# Patient Record
Sex: Female | Born: 1990
Health system: Southern US, Community
[De-identification: ages and names within clinical notes are randomized; demographics above are authoritative.]

## PROBLEM LIST (undated history)

## (undated) DIAGNOSIS — F329 Major depressive disorder, single episode, unspecified: Secondary | ICD-10-CM

## (undated) DIAGNOSIS — G43909 Migraine, unspecified, not intractable, without status migrainosus: Secondary | ICD-10-CM

## (undated) DIAGNOSIS — F32A Depression, unspecified: Secondary | ICD-10-CM

## (undated) DIAGNOSIS — I10 Essential (primary) hypertension: Secondary | ICD-10-CM

## (undated) HISTORY — DX: Major depressive disorder, single episode, unspecified: F32.9

## (undated) HISTORY — DX: Migraine, unspecified, not intractable, without status migrainosus: G43.909

## (undated) HISTORY — PX: ANKLE SURGERY: SHX546

## (undated) HISTORY — DX: Depression, unspecified: F32.A

## (undated) HISTORY — DX: Essential (primary) hypertension: I10

---

## 2016-09-16 NOTE — L&D Delivery Note (Signed)
Delivery Note At 5:30 PM a viable female was delivered via Vaginal, Spontaneous Delivery (Presentation: DOA;  ).  APGAR: 9, 9; weight  .   Placenta status: routine , .  Cord: WNL with the following complications: none .  Cord pH: not sent  Anesthesia:   Episiotomy: None Lacerations: 2nd degree Suture Repair: 3.0 vicryl Est. Blood Loss (mL): 200  It's a girl - "Diane Rivera"! Mom to postpartum.  Baby to Couplet care / Skin to Skin.   Diane Rivera 07/03/2017, 5:42 PM

## 2016-12-03 LAB — OB RESULTS CONSOLE ANTIBODY SCREEN: Antibody Screen: NEGATIVE

## 2016-12-03 LAB — OB RESULTS CONSOLE ABO/RH: RH Type: POSITIVE

## 2016-12-03 LAB — OB RESULTS CONSOLE RPR: RPR: NONREACTIVE

## 2016-12-03 LAB — OB RESULTS CONSOLE GC/CHLAMYDIA
Chlamydia: NEGATIVE
Gonorrhea: NEGATIVE

## 2016-12-03 LAB — OB RESULTS CONSOLE HEPATITIS B SURFACE ANTIGEN: Hepatitis B Surface Ag: NEGATIVE

## 2016-12-03 LAB — OB RESULTS CONSOLE RUBELLA ANTIBODY, IGM: Rubella: IMMUNE

## 2016-12-03 LAB — OB RESULTS CONSOLE HIV ANTIBODY (ROUTINE TESTING): HIV: NONREACTIVE

## 2017-02-06 DIAGNOSIS — Z363 Encounter for antenatal screening for malformations: Secondary | ICD-10-CM | POA: Diagnosis not present

## 2017-02-06 DIAGNOSIS — Z3400 Encounter for supervision of normal first pregnancy, unspecified trimester: Secondary | ICD-10-CM | POA: Diagnosis not present

## 2017-02-18 DIAGNOSIS — Z362 Encounter for other antenatal screening follow-up: Secondary | ICD-10-CM | POA: Diagnosis not present

## 2017-02-18 DIAGNOSIS — Z3492 Encounter for supervision of normal pregnancy, unspecified, second trimester: Secondary | ICD-10-CM | POA: Diagnosis not present

## 2017-03-18 DIAGNOSIS — Z3A24 24 weeks gestation of pregnancy: Secondary | ICD-10-CM | POA: Diagnosis not present

## 2017-03-18 DIAGNOSIS — Z362 Encounter for other antenatal screening follow-up: Secondary | ICD-10-CM | POA: Diagnosis not present

## 2017-04-18 DIAGNOSIS — Z3480 Encounter for supervision of other normal pregnancy, unspecified trimester: Secondary | ICD-10-CM | POA: Diagnosis not present

## 2017-04-18 DIAGNOSIS — Z23 Encounter for immunization: Secondary | ICD-10-CM | POA: Diagnosis not present

## 2017-04-18 DIAGNOSIS — Z3400 Encounter for supervision of normal first pregnancy, unspecified trimester: Secondary | ICD-10-CM | POA: Diagnosis not present

## 2017-06-13 DIAGNOSIS — Z3685 Encounter for antenatal screening for Streptococcus B: Secondary | ICD-10-CM | POA: Diagnosis not present

## 2017-06-13 DIAGNOSIS — Z3400 Encounter for supervision of normal first pregnancy, unspecified trimester: Secondary | ICD-10-CM | POA: Diagnosis not present

## 2017-06-13 DIAGNOSIS — Z113 Encounter for screening for infections with a predominantly sexual mode of transmission: Secondary | ICD-10-CM | POA: Diagnosis not present

## 2017-06-21 ENCOUNTER — Inpatient Hospital Stay (HOSPITAL_COMMUNITY): Admission: AD | Admit: 2017-06-21 | Payer: Self-pay | Source: Ambulatory Visit | Admitting: Obstetrics and Gynecology

## 2017-07-02 ENCOUNTER — Telehealth (HOSPITAL_COMMUNITY): Payer: Self-pay | Admitting: *Deleted

## 2017-07-02 ENCOUNTER — Encounter (HOSPITAL_COMMUNITY): Payer: Self-pay | Admitting: *Deleted

## 2017-07-02 LAB — OB RESULTS CONSOLE GBS: GBS: NEGATIVE

## 2017-07-02 NOTE — Telephone Encounter (Signed)
Preadmission screen  

## 2017-07-02 NOTE — H&P (Signed)
Diane LaiCatherine Rivera is a 26 y.o. female presenting for IOL. OB History    Gravida Para Term Preterm AB Living   2 1 1     1    SAB TAB Ectopic Multiple Live Births           1     Past Medical History:  Diagnosis Date  . Depression    MODERATE PP DEPRESSIONS meds for 6 mos  . Hypertension    Past Surgical History:  Procedure Laterality Date  . ANKLE SURGERY     Family History: family history includes Hypertension in her father; Lung cancer in her maternal grandmother. Social History:  reports that she has never smoked. She has never used smokeless tobacco. She reports that she does not drink alcohol or use drugs.     Maternal Diabetes: No Genetic Screening: Normal Maternal Ultrasounds/Referrals: Normal Fetal Ultrasounds or other Referrals:  None Maternal Substance Abuse:  No Significant Maternal Medications:  None Significant Maternal Lab Results:  None Other Comments:  None  ROS History   Last menstrual period 10/01/2016. Exam Physical Exam  (from clinic) NAD, A&O NWOB Abd soft, nondistended, gravid SVE 2/50/-1, vertex  Prenatal labs: ABO, Rh: O/Positive/-- (03/20 0000) Antibody: Negative (03/20 0000) Rubella: Immune (03/20 0000) RPR: Nonreactive (03/20 0000)  HBsAg: Negative (03/20 0000)  HIV: Non-reactive (03/20 0000)  GBS: Negative (10/17 0000)   Assessment/Plan: 26yo G2P1001 @ 39.2wga presenting for eIOL. Plan pitocin/AROM.  Pregnancy uncomplicated.  H/o cHTN w/first pregnancy - none this pregnancy. GBS neg.    Diane Rivera 07/02/2017, 1:46 PM

## 2017-07-03 ENCOUNTER — Encounter (HOSPITAL_COMMUNITY): Payer: Self-pay

## 2017-07-03 ENCOUNTER — Inpatient Hospital Stay (HOSPITAL_COMMUNITY): Payer: 59 | Admitting: Anesthesiology

## 2017-07-03 ENCOUNTER — Inpatient Hospital Stay (HOSPITAL_COMMUNITY)
Admission: RE | Admit: 2017-07-03 | Discharge: 2017-07-04 | DRG: 807 | Disposition: A | Discharge status: Home / Self Care | Payer: 59 | Source: Ambulatory Visit | Attending: Obstetrics and Gynecology | Admitting: Obstetrics and Gynecology

## 2017-07-03 VITALS — BP 131/75 | HR 91 | Temp 97.9°F | Resp 20 | Ht 66.0 in | Wt 295.0 lb

## 2017-07-03 DIAGNOSIS — Z3490 Encounter for supervision of normal pregnancy, unspecified, unspecified trimester: Secondary | ICD-10-CM

## 2017-07-03 DIAGNOSIS — O26893 Other specified pregnancy related conditions, third trimester: Secondary | ICD-10-CM | POA: Diagnosis present

## 2017-07-03 DIAGNOSIS — O164 Unspecified maternal hypertension, complicating childbirth: Secondary | ICD-10-CM | POA: Diagnosis not present

## 2017-07-03 DIAGNOSIS — Z3A39 39 weeks gestation of pregnancy: Secondary | ICD-10-CM

## 2017-07-03 LAB — TYPE AND SCREEN
ABO/RH(D): O POS
Antibody Screen: NEGATIVE

## 2017-07-03 LAB — PROTEIN / CREATININE RATIO, URINE
Creatinine, Urine: 60 mg/dL
Total Protein, Urine: <6 mg/dL

## 2017-07-03 LAB — COMPREHENSIVE METABOLIC PANEL
ALT: 14 U/L (ref 14–54)
AST: 25 U/L (ref 15–41)
Albumin: 2.7 g/dL — ABNORMAL LOW (ref 3.5–5.0)
Alkaline Phosphatase: 125 U/L (ref 38–126)
Anion gap: 10 (ref 5–15)
BUN: 6 mg/dL (ref 6–20)
CO2: 23 mmol/L (ref 22–32)
Calcium: 8.7 mg/dL — ABNORMAL LOW (ref 8.9–10.3)
Chloride: 101 mmol/L (ref 101–111)
Creatinine, Ser: 0.58 mg/dL (ref 0.44–1.00)
GFR calc Af Amer: >60 mL/min (ref >60)
GFR calc non Af Amer: >60 mL/min (ref >60)
Glucose, Bld: 89 mg/dL (ref 65–99)
Potassium: 3.8 mmol/L (ref 3.5–5.1)
Sodium: 134 mmol/L — ABNORMAL LOW (ref 135–145)
Total Bilirubin: 0.3 mg/dL (ref 0.3–1.2)
Total Protein: 6.5 g/dL (ref 6.5–8.1)

## 2017-07-03 LAB — ABO/RH: ABO/RH(D): O POS

## 2017-07-03 LAB — CBC
HCT: 34.6 % — ABNORMAL LOW (ref 36.0–46.0)
Hemoglobin: 11.2 g/dL — ABNORMAL LOW (ref 12.0–15.0)
MCH: 28 pg (ref 26.0–34.0)
MCHC: 32.4 g/dL (ref 30.0–36.0)
MCV: 86.5 fL (ref 78.0–100.0)
Platelets: 222 10*3/uL (ref 150–400)
RBC: 4 MIL/uL (ref 3.87–5.11)
RDW: 14.8 % (ref 11.5–15.5)
WBC: 11.1 10*3/uL — ABNORMAL HIGH (ref 4.0–10.5)

## 2017-07-03 LAB — RPR: RPR Ser Ql: NONREACTIVE

## 2017-07-03 LAB — PREGNANCY, URINE: Preg Test, Ur: POSITIVE — AB

## 2017-07-03 MED ORDER — DIBUCAINE 1 % RE OINT: 1.0000 "application " | TOPICAL_OINTMENT | RECTAL | Status: DC | PRN

## 2017-07-03 MED ORDER — EPHEDRINE 5 MG/ML INJ
10.0000 mg | INTRAVENOUS | Status: DC | PRN
  Filled 2017-07-03: qty 2

## 2017-07-03 MED ORDER — TERBUTALINE SULFATE 1 MG/ML IJ SOLN
0.2500 mg | Freq: Once | INTRAMUSCULAR | Status: DC | PRN
  Filled 2017-07-03: qty 1

## 2017-07-03 MED ORDER — PHENYLEPHRINE 40 MCG/ML (10ML) SYRINGE FOR IV PUSH (FOR BLOOD PRESSURE SUPPORT)
80.0000 ug | PREFILLED_SYRINGE | INTRAVENOUS | Status: DC | PRN
  Filled 2017-07-03: qty 10
  Filled 2017-07-03: qty 5

## 2017-07-03 MED ORDER — ACETAMINOPHEN 325 MG PO TABS: 650.0000 mg | ORAL_TABLET | ORAL | Status: DC | PRN

## 2017-07-03 MED ORDER — PHENYLEPHRINE 40 MCG/ML (10ML) SYRINGE FOR IV PUSH (FOR BLOOD PRESSURE SUPPORT)
80.0000 ug | PREFILLED_SYRINGE | INTRAVENOUS | Status: DC | PRN
  Filled 2017-07-03: qty 5

## 2017-07-03 MED ORDER — TETANUS-DIPHTH-ACELL PERTUSSIS 5-2.5-18.5 LF-MCG/0.5 IM SUSP: 0.5000 mL | Freq: Once | INTRAMUSCULAR | Status: DC

## 2017-07-03 MED ORDER — SOD CITRATE-CITRIC ACID 500-334 MG/5ML PO SOLN: 30.0000 mL | ORAL | Status: DC | PRN

## 2017-07-03 MED ORDER — FENTANYL 2.5 MCG/ML BUPIVACAINE 1/10 % EPIDURAL INFUSION (WH - ANES)
14.0000 mL/h | INTRAMUSCULAR | Status: DC | PRN
  Administered 2017-07-03: 14 mL/h via EPIDURAL
  Filled 2017-07-03: qty 100

## 2017-07-03 MED ORDER — ONDANSETRON HCL 4 MG/2ML IJ SOLN: 4.0000 mg | Freq: Four times a day (QID) | INTRAMUSCULAR | Status: DC | PRN

## 2017-07-03 MED ORDER — LACTATED RINGERS IV SOLN
INTRAVENOUS | Status: DC
  Administered 2017-07-03: 08:00:00 via INTRAVENOUS

## 2017-07-03 MED ORDER — OXYCODONE-ACETAMINOPHEN 5-325 MG PO TABS: 1.0000 | ORAL_TABLET | ORAL | Status: DC | PRN

## 2017-07-03 MED ORDER — LIDOCAINE HCL (PF) 1 % IJ SOLN
30.0000 mL | INTRAMUSCULAR | Status: DC | PRN
  Filled 2017-07-03: qty 30

## 2017-07-03 MED ORDER — FLEET ENEMA 7-19 GM/118ML RE ENEM
1.0000 | ENEMA | RECTAL | Status: DC | PRN
Start: 2017-07-03 — End: 2017-07-03

## 2017-07-03 MED ORDER — SIMETHICONE 80 MG PO CHEW: 80.0000 mg | CHEWABLE_TABLET | ORAL | Status: DC | PRN

## 2017-07-03 MED ORDER — ZOLPIDEM TARTRATE 5 MG PO TABS: 5.0000 mg | ORAL_TABLET | Freq: Every evening | ORAL | Status: DC | PRN

## 2017-07-03 MED ORDER — OXYTOCIN 40 UNITS IN LACTATED RINGERS INFUSION - SIMPLE MED
1.0000 m[IU]/min | INTRAVENOUS | Status: DC
  Administered 2017-07-03: 2 m[IU]/min via INTRAVENOUS
  Filled 2017-07-03: qty 1000

## 2017-07-03 MED ORDER — OXYCODONE-ACETAMINOPHEN 5-325 MG PO TABS: 2.0000 | ORAL_TABLET | ORAL | Status: DC | PRN

## 2017-07-03 MED ORDER — OXYTOCIN BOLUS FROM INFUSION: 500.0000 mL | Freq: Once | INTRAVENOUS | Status: DC

## 2017-07-03 MED ORDER — LACTATED RINGERS IV SOLN
500.0000 mL | Freq: Once | INTRAVENOUS | Status: AC
  Administered 2017-07-03: 500 mL via INTRAVENOUS

## 2017-07-03 MED ORDER — LACTATED RINGERS IV SOLN: 500.0000 mL | Freq: Once | INTRAVENOUS | Status: DC

## 2017-07-03 MED ORDER — HYDROMORPHONE HCL 1 MG/ML IJ SOLN: 0.5000 mg | INTRAMUSCULAR | Status: DC | PRN

## 2017-07-03 MED ORDER — ONDANSETRON HCL 4 MG PO TABS: 4.0000 mg | ORAL_TABLET | ORAL | Status: DC | PRN

## 2017-07-03 MED ORDER — LIDOCAINE HCL (PF) 1 % IJ SOLN
INTRAMUSCULAR | Status: DC | PRN
  Administered 2017-07-03 (×4): 5 mL via EPIDURAL

## 2017-07-03 MED ORDER — WITCH HAZEL-GLYCERIN EX PADS: 1.0000 "application " | MEDICATED_PAD | CUTANEOUS | Status: DC | PRN

## 2017-07-03 MED ORDER — DIPHENHYDRAMINE HCL 25 MG PO CAPS: 25.0000 mg | ORAL_CAPSULE | Freq: Four times a day (QID) | ORAL | Status: DC | PRN

## 2017-07-03 MED ORDER — LACTATED RINGERS IV SOLN: 500.0000 mL | INTRAVENOUS | Status: DC | PRN

## 2017-07-03 MED ORDER — DIPHENHYDRAMINE HCL 50 MG/ML IJ SOLN: 12.5000 mg | INTRAMUSCULAR | Status: DC | PRN

## 2017-07-03 MED ORDER — COCONUT OIL OIL: 1.0000 "application " | TOPICAL_OIL | Status: DC | PRN

## 2017-07-03 MED ORDER — OXYCODONE HCL 5 MG PO TABS: 10.0000 mg | ORAL_TABLET | ORAL | Status: DC | PRN

## 2017-07-03 MED ORDER — PRENATAL MULTIVITAMIN CH
1.0000 | ORAL_TABLET | Freq: Every day | ORAL | Status: DC
  Administered 2017-07-04: 1 via ORAL
  Filled 2017-07-03: qty 1

## 2017-07-03 MED ORDER — HYDROMORPHONE BOLUS VIA INFUSION: 0.5000 mg | INTRAVENOUS | Status: DC | PRN

## 2017-07-03 MED ORDER — IBUPROFEN 600 MG PO TABS
600.0000 mg | ORAL_TABLET | Freq: Four times a day (QID) | ORAL | Status: DC
  Administered 2017-07-04 (×3): 600 mg via ORAL
  Filled 2017-07-03 (×3): qty 1

## 2017-07-03 MED ORDER — SENNOSIDES-DOCUSATE SODIUM 8.6-50 MG PO TABS
2.0000 | ORAL_TABLET | ORAL | Status: DC
  Administered 2017-07-04: 2 via ORAL
  Filled 2017-07-03: qty 2

## 2017-07-03 MED ORDER — ONDANSETRON HCL 4 MG/2ML IJ SOLN
4.0000 mg | INTRAMUSCULAR | Status: DC | PRN
Start: 2017-07-03 — End: 2017-07-04

## 2017-07-03 MED ORDER — BENZOCAINE-MENTHOL 20-0.5 % EX AERO
1.0000 "application " | INHALATION_SPRAY | CUTANEOUS | Status: DC | PRN
  Administered 2017-07-03: 1 via TOPICAL
  Filled 2017-07-03: qty 56

## 2017-07-03 MED ORDER — OXYTOCIN 40 UNITS IN LACTATED RINGERS INFUSION - SIMPLE MED: 2.5000 [IU]/h | INTRAVENOUS | Status: DC

## 2017-07-03 MED ORDER — OXYCODONE HCL 5 MG PO TABS: 5.0000 mg | ORAL_TABLET | ORAL | Status: DC | PRN

## 2017-07-03 NOTE — Progress Notes (Signed)
S: Comf, no c/o.  O:  Vitals:   07/03/17 0723  BP: (!) 137/96  Pulse: (!) 122  Resp: 16  Temp: 98.8 F (37.1 C)   Gen: well appearing, NAD ABd: gravid SVE 3.5/long/-2, AROM for clear fluid Toco q 5 min  A/P: 26 yo G2P1001 @ 39.2wga presenting for eIOL. Pitocin. S/p AROM for clear fluid EFW 8#0 BP mildly elevated - no s/s sev dz. Labs sent. H/o htn w/last pregnancy GBS neg  Rosie FateE Otelia Hettinger MD

## 2017-07-03 NOTE — Anesthesia Procedure Notes (Signed)
Epidural Patient location during procedure: OB Start time: 07/03/2017 3:57 PM End time: 07/03/2017 4:05 PM  Staffing Anesthesiologist: Chaney MallingHODIERNE, Bebe Moncure Performed: anesthesiologist   Preanesthetic Checklist Completed: patient identified, site marked, pre-op evaluation, timeout performed, IV checked, risks and benefits discussed and monitors and equipment checked  Epidural Patient position: sitting Prep: DuraPrep Patient monitoring: heart rate, cardiac monitor, continuous pulse ox and blood pressure Approach: midline Location: L3-L4 Injection technique: LOR air  Needle:  Needle type: Tuohy  Needle gauge: 17 G Needle length: 9 cm Needle insertion depth: 9 cm Catheter type: closed end flexible Catheter size: 19 Gauge Catheter at skin depth: 14 cm Test dose: negative and Other  Assessment Events: blood not aspirated, injection not painful, no injection resistance and negative IV test  Additional Notes Informed consent obtained prior to proceeding including risk of failure, 1% risk of PDPH, risk of minor discomfort and bruising.  Discussed rare but serious complications including epidural abscess, permanent nerve injury, epidural hematoma.  Discussed alternatives to epidural analgesia and patient desires to proceed.  Timeout performed pre-procedure verifying patient name, procedure, and platelet count.  Patient tolerated procedure well. Reason for block:procedure for pain

## 2017-07-03 NOTE — Anesthesia Preprocedure Evaluation (Signed)
Anesthesia Evaluation  Patient identified by MRN, date of birth, ID band Patient awake    Reviewed: Allergy & Precautions, H&P , NPO status , Patient's Chart, lab work & pertinent test results  Airway Mallampati: II   Neck ROM: full    Dental   Pulmonary neg pulmonary ROS,    breath sounds clear to auscultation       Cardiovascular hypertension,  Rhythm:regular Rate:Normal     Neuro/Psych    GI/Hepatic   Endo/Other    Renal/GU      Musculoskeletal   Abdominal   Peds  Hematology   Anesthesia Other Findings   Reproductive/Obstetrics (+) Pregnancy                             Anesthesia Physical Anesthesia Plan  ASA: II  Anesthesia Plan: Epidural   Post-op Pain Management:    Induction: Intravenous  PONV Risk Score and Plan: 2 and Treatment may vary due to age or medical condition  Airway Management Planned: Natural Airway  Additional Equipment:   Intra-op Plan:   Post-operative Plan:   Informed Consent: I have reviewed the patients History and Physical, chart, labs and discussed the procedure including the risks, benefits and alternatives for the proposed anesthesia with the patient or authorized representative who has indicated his/her understanding and acceptance.     Plan Discussed with: CRNA and Anesthesiologist  Anesthesia Plan Comments:         Anesthesia Quick Evaluation

## 2017-07-03 NOTE — Anesthesia Procedure Notes (Signed)
Epidural Patient location during procedure: OB Start time: 07/03/2017 3:05 PM End time: 07/03/2017 3:16 PM  Staffing Anesthesiologist: Chaney MallingHODIERNE, Seth Higginbotham Performed: anesthesiologist   Preanesthetic Checklist Completed: patient identified, site marked, pre-op evaluation, timeout performed, IV checked, risks and benefits discussed and monitors and equipment checked  Epidural Patient position: sitting Prep: DuraPrep Patient monitoring: heart rate, cardiac monitor, continuous pulse ox and blood pressure Approach: midline Location: L2-L3 Injection technique: LOR saline  Needle:  Needle type: Tuohy  Needle gauge: 17 G Needle length: 9 cm Needle insertion depth: 9 cm Catheter type: closed end flexible Catheter size: 19 Gauge Catheter at skin depth: 14 cm Test dose: negative and Other  Assessment Events: blood not aspirated, injection not painful, no injection resistance and negative IV test  Additional Notes Informed consent obtained prior to proceeding including risk of failure, 1% risk of PDPH, risk of minor discomfort and bruising.  Discussed rare but serious complications including epidural abscess, permanent nerve injury, epidural hematoma.  Discussed alternatives to epidural analgesia and patient desires to proceed.  Timeout performed pre-procedure verifying patient name, procedure, and platelet count.  Patient tolerated procedure well. Reason for block:procedure for pain

## 2017-07-03 NOTE — Anesthesia Pain Management Evaluation Note (Signed)
  CRNA Pain Management Visit Note  Patient: Diane Rivera, 26 y.o., female  "Hello I am a [facility] of the anesthesia [facility] at [facility]. We have an anesthesia team available at all times to provide care throughout the hospital, including epidural management and anesthesia for C-section. I don't know your plan for the delivery whether it a natural birth, water birth, IV sedation, nitrous supplementation, doula or epidural, but we want to meet your pain goals."   1.Was your pain managed to your expectations on prior hospitalizations?   Yes   2.What is your expectation for pain management during this hospitalization?     Labor support without medications  3.How can we help you reach that goal? Nursing intervention  Record the patient's initial score and the patient's pain goal.   Pain: 1/10  Pain Goal: 1/10 [facility] wants you to be able to say your pain was always managed very well.  [name], [name] 07/03/2017

## 2017-07-03 NOTE — Progress Notes (Signed)
4/70/-2 Cont pitocin titration   Rosie FateE Faustine Tates MD

## 2017-07-04 ENCOUNTER — Ambulatory Visit: Payer: Self-pay

## 2017-07-04 LAB — BIRTH TISSUE RECOVERY COLLECTION (PLACENTA DONATION)

## 2017-07-04 LAB — CBC
HCT: 33.5 % — ABNORMAL LOW (ref 36.0–46.0)
Hemoglobin: 10.7 g/dL — ABNORMAL LOW (ref 12.0–15.0)
MCH: 27.8 pg (ref 26.0–34.0)
MCHC: 31.9 g/dL (ref 30.0–36.0)
MCV: 87 fL (ref 78.0–100.0)
Platelets: 213 10*3/uL (ref 150–400)
RBC: 3.85 MIL/uL — ABNORMAL LOW (ref 3.87–5.11)
RDW: 15 % (ref 11.5–15.5)
WBC: 15 10*3/uL — ABNORMAL HIGH (ref 4.0–10.5)

## 2017-07-04 NOTE — Discharge Summary (Signed)
Obstetric Discharge Summary Reason for Admission: induction of labor Prenatal Procedures: none Intrapartum Procedures: spontaneous vaginal delivery Postpartum Procedures: none Complications-Operative and Postpartum: 2nd degree perineal laceration Hemoglobin  Date Value Ref Range Status  07/04/2017 10.7 (L) 12.0 - 15.0 g/dL Final   HCT  Date Value Ref Range Status  07/04/2017 33.5 (L) 36.0 - 46.0 % Final    Physical Exam:  General: alert, cooperative and appears stated age 29Lochia: appropriate Uterine Fundus: firm Incision: healing well, no significant drainage, no dehiscence DVT Evaluation: No evidence of DVT seen on physical exam.  Discharge Diagnoses: Term Pregnancy-delivered  Discharge Information: Date: 07/04/2017 Activity: pelvic rest Diet: routine Medications: Ibuprofen Condition: improved Instructions: refer to practice specific booklet Discharge to: home   Newborn Data: Live born female  Birth Weight: 7 lb 14 oz (3572 g) APGAR: 9, 9  Newborn Delivery   Birth date/time:  07/03/2017 17:30:00 Delivery type:  Vaginal, Spontaneous Delivery      Home with mother.  Diane Rivera L 07/04/2017, 7:40 AM

## 2017-07-04 NOTE — Progress Notes (Signed)
CSW attempted again to meet with MOB to offer support and complete assessment for hx of PMADs, but she, again, had numerous visitors in the room.  CSW left report for weekend CSW asking for her to follow up with MOB tomorrow prior to baby's discharge if possible. 

## 2017-07-04 NOTE — Anesthesia Postprocedure Evaluation (Signed)
Anesthesia Post Note  Patient: Diane Rivera  Procedure(s) Performed: AN AD HOC LABOR EPIDURAL     Patient location during evaluation: Mother Baby Anesthesia Type: Epidural Level of consciousness: awake and alert and oriented Pain management: satisfactory to patient Vital Signs Assessment: post-procedure vital signs reviewed and stable Respiratory status: respiratory function stable Cardiovascular status: stable Postop Assessment: no headache, no backache, epidural receding, patient able to bend at knees, no signs of nausea or vomiting and adequate PO intake Anesthetic complications: no    Last Vitals:  Vitals:   07/03/17 2040 07/04/17 0016  BP: 140/76 118/66  Pulse: (!) 101 (!) 104  Resp: 18 18  Temp: 36.9 C 36.8 C  SpO2: 99% 99%    Last Pain:  Vitals:   07/04/17 0245  TempSrc:   PainSc: Asleep   Pain Goal:                 Darrelle Barrell

## 2017-07-04 NOTE — Lactation Note (Signed)
This note was copied from a baby's chart. Lactation Consultation Note  Patient Name: Diane Rivera's Date: 07/04/2017 Reason for consult: Follow-up assessment  Baby 21 hours old. Mom reports that she has used the NS a few times, but baby is able to latch without it. Mom states that she is using pump, as she did with her first child--and believes that she will probably pump and bottle-feed EBM at home. Enc mom to keep putting baby to breast for stimulation and to enc a good milk supply. Enc mom to call for assistance as needed.   Maternal Data    Feeding Feeding Type: Breast Fed Length of feed: 15 min  LATCH Score                   Interventions    Lactation Tools Discussed/Used     Consult Status Consult Status: PRN    Sherlyn HayJennifer D Lyn Deemer 07/04/2017, 3:13 PM

## 2017-07-04 NOTE — Lactation Note (Signed)
This note was copied from a baby's chart. Lactation Consultation Note Mom BF her 1st child BF for 2 days in hospital then when she got home then started pumping and bottle feeding for 2 months. Had plenty of milk supply. Mom has pendulous breast w/flat nipples that compress easily for latch if baby would maintain. Baby sleepy at this time, has no interest in BF. Asked mom to call for assistance in latching when cueing. Hand expression taught collected 9ml. Instructed mom to give baby colostrum to stimulate to feed and STS. Discussed newborn behavior and feeding habits. Discussed importance of I&O, cluster feeding, supply and demand. RN set up DEBP d/t has given mom NS #20 d/t baby unable to maintain latch and mom has flat nipples. RN did stated breast compressible. Shells given, encouraged to wear in bra in am. 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 Mom encouraged to feed baby 8-12 times/24 hours and with feeding cues.  WH/LC brochure given w/resources, support groups and LC services. Patient Name: Diane Rivera ZOXWR'UToday's Date: 07/04/2017 Reason for consult: Initial assessment   Maternal Data Has patient been taught Hand Expression?: Yes Does the patient have breastfeeding experience prior to this delivery?: Yes  Feeding Feeding Type: Breast Fed Length of feed: 30 min (off and on )  LATCH Score Latch: Repeated attempts needed to sustain latch, nipple held in mouth throughout feeding, stimulation needed to elicit sucking reflex.  Audible Swallowing: A few with stimulation  Type of Nipple: Flat  Comfort (Breast/Nipple): Soft / non-tender  Hold (Positioning): No assistance needed to correctly position infant at breast.  LATCH Score: 7  Interventions Interventions: Breast feeding basics reviewed;Breast compression;Assisted with latch;Adjust position;Skin to skin;Support pillows;DEBP;Breast massage;Position options;Hand express;Expressed milk;Reverse pressure;Pre-pump if  needed;Shells  Lactation Tools Discussed/Used Tools: Shells;Pump Shell Type: Inverted Breast pump type: Double-Electric Breast Pump Pump Review: Setup, frequency, and cleaning;Milk Storage Initiated by:: Dolly RiasKim Isley  Date initiated:: 07/04/17   Consult Status Consult Status: Follow-up Date: 07/04/17 Follow-up type: In-patient    Alixandrea Milleson, Diamond NickelLAURA G 07/04/2017, 3:25 AM

## 2017-07-04 NOTE — Progress Notes (Signed)
CSW attempted to meet with MOB to offer support and complete assessment due to hx of PMADs, but she had numerous family members present at this time.  CSW will attempt again at a later time.  MOB agreed and states she is no longer planning to discharge today.

## 2017-07-05 ENCOUNTER — Ambulatory Visit: Payer: Self-pay

## 2017-07-05 NOTE — Lactation Note (Signed)
This note was copied from a baby's chart. Lactation Consultation Note  Patient Name: Diane Joaquim LaiCatherine Mohr JYNWG'NToday's Date: Rivera Reason for consult: Follow-up assessment;Hyperbilirubinemia Infant is 3947 hours old [facility] Baby was asleep in crib with phototherapy lights on when LC entered. Mom reports BF is going well with the size 24 nipple shield and is able to pump a little more each time (mom reports she has tried the 20mm & 24mm NS and prefers the #24). Mom reports baby last fed for ~40 mins and she heard swallows & saw milk in the nipple shield after BF. Mom reports her nipples are a little tender when BF. Encouraged mom to either rub some of her EBM on her nipples after BF or she could ask her RN for some coconut oil and use that after BF. Encouraged mom to [facility] relatch if she is having pain while BF. Mom reports she is BF on demand followed by pumping and sometimes hand expressing and they are giving back to baby anything she pumps. 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 Mom reports no questions at this time. Encouraged mom to ask for help as needed.  Maternal Data    Feeding Feeding Type: Bottle Fed - Breast Milk Length of feed: 40 min (per mom)  LATCH Score Latch: Grasps breast easily, tongue down, lips flanged, rhythmical sucking.  Audible Swallowing: A few with stimulation  Type of Nipple: Everted at rest and after stimulation  Comfort (Breast/Nipple): Filling, red/small blisters or bruises, mild/mod discomfort  Hold (Positioning): No assistance needed to correctly position infant at breast.  LATCH Score: 8  Interventions    Lactation Tools Discussed/Used Tools: Nipple Shields Nipple shield size: 24   Consult Status Consult Status: Follow-up Date: 07/06/17 Follow-up type: In-patient    Oneal GroutLaura C Vernestine Brodhead Rivera, 5:03 PM

## 2017-07-05 NOTE — Progress Notes (Signed)
CSW met with MOB at bedside to assess hx of PPD. Upon this writers arrival, MOB was warm and welcoming. This Probation officer explained role and reasoning for visit being due to hx of PPD. MOB noted understanding. CSW inquired about PPD with her first child. MOB notes she experienced it pretty bad; however, notes, if she had went to her doctor sooner rather than waiting, it would have been more manageable for her. This Probation officer discussed signs and symptoms of PPD with MOB and reviewed what is normal vs unnormal. MOB noted understanding and noted she is aware of the groups held here and will seek out her resources if she feels symptoms onset. This Probation officer thanked MOB for her time and congratulated her on baby's arrival. CSW has no concerns and/or barriers to discharge.   Lin Glazier, MSW, LCSW-A Clinical Social Worker  [facility]  Office: [phone]

## 2017-07-06 ENCOUNTER — Ambulatory Visit: Payer: Self-pay

## 2017-07-06 NOTE — Lactation Note (Signed)
This note was copied from a baby's chart. Lactation Consultation Note  Patient Name: Diane Joaquim LaiCatherine Scharfenberg ZOXWR'UToday's Date: 07/06/2017 Reason for consult: Follow-up assessment   Mother latching baby upon entering using #24NS.  Baby recently came off phototherapy lights. Discussed waking baby if needed for feedings.  Mom encouraged to feed baby 8-12 times/24 hours and with feeding cues.  Suggest trying without NS halfway through feedings at least once per day. Mother is post pumping 30-40 ml.  Praised her for her efforts. She is giving volume w/ slow flow nipple.  Discussed spoon and syringe also. Suggest mother continue post pumping after feeding 4-5 times per day, giving volume back to baby at next feeding. Reviewed engorgement care and monitoring voids/stools. Provided mother with another NS. Offered OP appointment but mother states she will call if needed.   Maternal Data    Feeding Feeding Type: Breast Fed Length of feed: 30 min  LATCH Score Latch: Grasps breast easily, tongue down, lips flanged, rhythmical sucking.  Audible Swallowing: A few with stimulation  Type of Nipple: Everted at rest and after stimulation  Comfort (Breast/Nipple): Filling, red/small blisters or bruises, mild/mod discomfort  Hold (Positioning): No assistance needed to correctly position infant at breast.  LATCH Score: 8  Interventions Interventions: Breast feeding basics reviewed;Breast compression;DEBP  Lactation Tools Discussed/Used Tools: Nipple Shields Nipple shield size: 24   Consult Status Consult Status: Complete    Dahlia ByesBerkelhammer, Ruth Boschen 07/06/2017, 10:00 AM

## 2017-08-18 DIAGNOSIS — Z1389 Encounter for screening for other disorder: Secondary | ICD-10-CM | POA: Diagnosis not present

## 2017-08-19 DIAGNOSIS — H5213 Myopia, bilateral: Secondary | ICD-10-CM | POA: Diagnosis not present

## 2017-08-19 DIAGNOSIS — H52223 Regular astigmatism, bilateral: Secondary | ICD-10-CM | POA: Diagnosis not present

## 2018-03-04 ENCOUNTER — Ambulatory Visit: Payer: No Typology Code available for payment source | Admitting: Medical

## 2018-03-13 ENCOUNTER — Encounter: Payer: Self-pay | Admitting: Family Medicine

## 2018-03-13 ENCOUNTER — Ambulatory Visit (INDEPENDENT_AMBULATORY_CARE_PROVIDER_SITE_OTHER): Payer: No Typology Code available for payment source | Admitting: Family Medicine

## 2018-03-13 VITALS — BP 108/78 | HR 72 | Temp 98.5°F | Ht 66.0 in | Wt 312.4 lb

## 2018-03-13 DIAGNOSIS — F419 Anxiety disorder, unspecified: Secondary | ICD-10-CM | POA: Diagnosis not present

## 2018-03-13 DIAGNOSIS — Z1283 Encounter for screening for malignant neoplasm of skin: Secondary | ICD-10-CM | POA: Diagnosis not present

## 2018-03-13 DIAGNOSIS — F32A Depression, unspecified: Secondary | ICD-10-CM

## 2018-03-13 DIAGNOSIS — Z0000 Encounter for general adult medical examination without abnormal findings: Secondary | ICD-10-CM

## 2018-03-13 DIAGNOSIS — R21 Rash and other nonspecific skin eruption: Secondary | ICD-10-CM | POA: Diagnosis not present

## 2018-03-13 DIAGNOSIS — F329 Major depressive disorder, single episode, unspecified: Secondary | ICD-10-CM | POA: Diagnosis not present

## 2018-03-13 MED ORDER — SERTRALINE HCL 50 MG PO TABS: 50.0000 mg | ORAL_TABLET | Freq: Every day | ORAL | 3 refills | Status: DC

## 2018-03-13 MED ORDER — KETOCONAZOLE 2 % EX CREA: 1.0000 "application " | TOPICAL_CREAM | Freq: Every day | CUTANEOUS | 0 refills | Status: DC

## 2018-03-13 NOTE — Progress Notes (Signed)
Pre visit review using our clinic review tool, if applicable. No additional management support is needed unless otherwise documented below in the visit note. 

## 2018-03-13 NOTE — Patient Instructions (Addendum)
OK to use Debrox (peroxide) in the ear to loosen up wax. Also recommend using a bulb syringe (for removing boogers from baby's noses) to flush through warm water. Do not use Q-tips as this can impact wax further.  1-2 days to get the results of your labs back.   Please consider counseling. Contact [phone] to schedule an appointment or inquire about cost/insurance coverage.  Coping skills Choose 5 that work for you:  Take a deep breath  Count to 20  Read a book  Do a puzzle  Meditate  Bake  Sing  Knit  Garden  Pray  Go outside  Call a friend  Listen to music  Take a walk  Color  Send a note  Take a bath  Watch a movie  Be alone in a quiet place  Pet an animal  Visit a friend  Journal  Exercise  Stretch   If you do not hear anything about your referral in the next 1-2 weeks, call our office and ask for an update.

## 2018-03-13 NOTE — Progress Notes (Signed)
Chief Complaint  Patient presents with  . New Patient (Initial Visit)     Well Woman Diane Rivera is here for a complete physical.   Her last physical was >1 year ago.  Current diet: in general, a "not great" diet. Current exercise: None No LMP recorded. Seatbelt? Yes  Health Maintenance Pap/HPV- Yes Tetanus- Yes HIV screening- Yes   Past Medical History:  Diagnosis Date  . Depression    MODERATE PP DEPRESSIONS meds for 6 mos  . Hypertension   . Migraines      Past Surgical History:  Procedure Laterality Date  . ANKLE SURGERY      Medications  Takes no meds routinely.   Allergies No Known Allergies  Review of Systems: Constitutional:  no unexpected weight changes Eye:  no recent significant change in vision Ear/Nose/Mouth/Throat:  Ears:  no tinnitus or vertigo and no recent change in hearing Nose/Mouth/Throat:  no complaints of nasal congestion, no sore throat Cardiovascular: no chest pain Respiratory:  no cough and no shortness of breath Gastrointestinal:  no abdominal pain, no change in bowel habits GU:  Female: negative for dysuria or pelvic pain Musculoskeletal/Extremities:  no pain of the joints Integumentary (Skin/Breast):  no abnormal skin lesions reported Neurologic:  no headaches Endocrine:  denies fatigue Psych: +anxiety/depression  Exam BP 108/78 (BP Location: Left Arm, Patient Position: Sitting, Cuff Size: Large)   Pulse 72   Temp 98.5 F (36.9 C) (Oral)   Ht 5\' 6"  (1.676 m)   Wt (!) 312 lb 6 oz (141.7 kg)   SpO2 98%   BMI 50.42 kg/m  General:  well developed, well nourished, in no apparent distress Skin: Hypopigmented macules and patches without scaling; no significant moles, warts, or growths Head:  no masses, lesions, or tenderness Eyes:  pupils equal and round, sclera anicteric without injection Ears:  canals without lesions, TMs shiny without retraction, no obvious effusion, no erythema Nose:  nares patent, septum midline,  mucosa normal, and no drainage or sinus tenderness Throat/Pharynx:  lips and gingiva without lesion; tongue and uvula midline; non-inflamed pharynx; no exudates or postnasal drainage Neck: neck supple without adenopathy, thyromegaly, or masses Lungs:  clear to auscultation, breath sounds equal bilaterally, no respiratory distress Cardio:  regular rate and rhythm, no bruits, no LE edema Abdomen:  abdomen soft, nontender; bowel sounds normal; no masses or organomegaly Genital: Defer to GYN Musculoskeletal:  symmetrical muscle groups noted without atrophy or deformity Extremities:  no clubbing, cyanosis, or edema, no deformities, no skin discoloration Neuro:  gait normal; deep tendon reflexes normal and symmetric Psych: well oriented with normal range of affect and appropriate judgment/insight  Assessment and Plan  Well adult exam - Plan: Comprehensive metabolic panel, CBC, Lipid panel, Lipid panel, CBC, Comprehensive metabolic panel  Skin exam, screening for cancer - Plan: Ambulatory referral to Dermatology  Rash - Plan: ketoconazole (NIZORAL) 2 % cream  Anxiety and depression - Plan: sertraline (ZOLOFT) 50 MG tablet   Well 27 y.o. female. Counseled on diet and exercise. Other orders as above. Refer to derm. Try ketoconazole covering for tinea versicolor. Start Zoloft. Anxiety coping info given. # for [facility] provided also. Follow up in 6 weeks. The patient voiced understanding and agreement to the plan.  Jilda Rocheicholas Paul CaberyWendling, DO 03/13/18 3:05 PM

## 2018-03-16 ENCOUNTER — Other Ambulatory Visit (INDEPENDENT_AMBULATORY_CARE_PROVIDER_SITE_OTHER): Payer: No Typology Code available for payment source

## 2018-03-16 DIAGNOSIS — Z0000 Encounter for general adult medical examination without abnormal findings: Secondary | ICD-10-CM

## 2018-03-16 LAB — CBC
HCT: 39.5 % (ref 36.0–46.0)
Hemoglobin: 12.9 g/dL (ref 12.0–15.0)
MCHC: 32.6 g/dL (ref 30.0–36.0)
MCV: 82.9 fl (ref 78.0–100.0)
Platelets: 293 10*3/uL (ref 150.0–400.0)
RBC: 4.76 Mil/uL (ref 3.87–5.11)
RDW: 14.4 % (ref 11.5–15.5)
WBC: 6.6 10*3/uL (ref 4.0–10.5)

## 2018-03-16 LAB — LIPID PANEL
Cholesterol: 160 mg/dL (ref 0–200)
HDL: 58.7 mg/dL (ref >39.00)
LDL Cholesterol: 73 mg/dL (ref 0–99)
NonHDL: 101.18
Total CHOL/HDL Ratio: 3
Triglycerides: 141 mg/dL (ref 0.0–149.0)
VLDL: 28.2 mg/dL (ref 0.0–40.0)

## 2018-03-16 LAB — COMPREHENSIVE METABOLIC PANEL
ALT: 13 U/L (ref 0–35)
AST: 15 U/L (ref 0–37)
Albumin: 4.2 g/dL (ref 3.5–5.2)
Alkaline Phosphatase: 88 U/L (ref 39–117)
BUN: 11 mg/dL (ref 6–23)
CO2: 30 mEq/L (ref 19–32)
Calcium: 9.1 mg/dL (ref 8.4–10.5)
Chloride: 105 mEq/L (ref 96–112)
Creatinine, Ser: 0.74 mg/dL (ref 0.40–1.20)
GFR: 101.23 mL/min (ref >60.00)
Glucose, Bld: 88 mg/dL (ref 70–99)
Potassium: 4.4 mEq/L (ref 3.5–5.1)
Sodium: 141 mEq/L (ref 135–145)
Total Bilirubin: 0.3 mg/dL (ref 0.2–1.2)
Total Protein: 6.6 g/dL (ref 6.0–8.3)

## 2018-04-24 ENCOUNTER — Ambulatory Visit: Payer: No Typology Code available for payment source | Admitting: Family Medicine

## 2018-04-27 ENCOUNTER — Ambulatory Visit (INDEPENDENT_AMBULATORY_CARE_PROVIDER_SITE_OTHER): Payer: No Typology Code available for payment source | Admitting: Family Medicine

## 2018-04-27 ENCOUNTER — Encounter: Payer: Self-pay | Admitting: Family Medicine

## 2018-04-27 ENCOUNTER — Other Ambulatory Visit: Payer: Self-pay | Admitting: Family Medicine

## 2018-04-27 VITALS — BP 120/70 | HR 85 | Temp 98.3°F | Ht 66.0 in | Wt 318.2 lb

## 2018-04-27 DIAGNOSIS — N926 Irregular menstruation, unspecified: Secondary | ICD-10-CM | POA: Diagnosis not present

## 2018-04-27 DIAGNOSIS — F419 Anxiety disorder, unspecified: Secondary | ICD-10-CM | POA: Diagnosis not present

## 2018-04-27 DIAGNOSIS — E66812 Obesity, class 2: Secondary | ICD-10-CM | POA: Insufficient documentation

## 2018-04-27 DIAGNOSIS — F32A Depression, unspecified: Secondary | ICD-10-CM | POA: Insufficient documentation

## 2018-04-27 DIAGNOSIS — F329 Major depressive disorder, single episode, unspecified: Secondary | ICD-10-CM

## 2018-04-27 DIAGNOSIS — R21 Rash and other nonspecific skin eruption: Secondary | ICD-10-CM

## 2018-04-27 LAB — POCT URINE PREGNANCY: Preg Test, Ur: NEGATIVE

## 2018-04-27 MED ORDER — KETOCONAZOLE 2 % EX CREA: 1.0000 "application " | TOPICAL_CREAM | Freq: Every day | CUTANEOUS | 0 refills | Status: DC

## 2018-04-27 MED ORDER — VENLAFAXINE HCL ER 75 MG PO CP24: 75.0000 mg | ORAL_CAPSULE | Freq: Every day | ORAL | 2 refills | Status: DC

## 2018-04-27 NOTE — Addendum Note (Signed)
Addended by: Scharlene GlossEWING, Jnai [place] B on: 04/27/2018 10:55 AM   Modules accepted: Orders

## 2018-04-27 NOTE — Progress Notes (Signed)
Chief Complaint  Patient presents with  . Follow-up    medication    Subjective Diane Rivera presents for f/u anxiety/depression.  She is currently being treated with Zoloft 50 m/d, started 6 weeks ago.  Reports slight benefit since treatment. No thoughts of harming self or others. No self-medication with alcohol, prescription drugs or illicit drugs. Therapies failed: Zoloft, Trintellix.  Pt is not following with a counselor/psychologist.  ROS Psych: No homicidal or suicidal thoughts  Past Medical History:  Diagnosis Date  . Depression    MODERATE PP DEPRESSIONS meds for 6 mos  . Hypertension   . Migraines    Exam BP 120/70 (BP Location: Left Arm, Patient Position: Sitting, Cuff Size: Large)   Pulse 85   Temp 98.3 F (36.8 C) (Oral)   Ht 5\' 6"  (1.676 m)   Wt (!) 318 lb 4 oz (144.4 kg)   SpO2 98%   BMI 51.37 kg/m  General:  well developed, well nourished, in no apparent distress Lungs:  clear to auscultation, breath sounds equal bilaterally, no respiratory distress Cardio:  regular rate and rhythm without murmurs, heart sounds without clicks or rubs Psych: well oriented with normal range of affect and age-appropriate judgement/insight, alert and oriented x4.  Assessment and Plan  Anxiety and depression - Plan: venlafaxine XR (EFFEXOR-XR) 75 MG 24 hr capsule  Missed period  Orders as above. Stop Zoloft, start SNRI. Counseled on exercise. Discussed contacting therapist. Urine preg neg.  F/u in 6 weeks. The patient voiced understanding and agreement to the plan.  Jilda Rocheicholas Paul [place], DO 04/27/18 10:35 AM

## 2018-04-27 NOTE — Progress Notes (Signed)
Pre visit review using our clinic review tool, if applicable. No additional management support is needed unless otherwise documented below in the visit note. 

## 2018-04-27 NOTE — Patient Instructions (Signed)
Stop taking Zoloft.  I think it is an excellent idea to start seeing the counseling team.  Aim to do some physical exertion for 150 minutes per week. This is typically divided into 5 days per week, 30 minutes per day. The activity should be enough to get your heart rate up. Anything is better than nothing if you have time constraints. This has been shown to be helpful with anxiety/depression (although certainly not the cornerstone of treatment).  Let us know if you need anything.

## 2018-06-05 MED FILL — VENLAFAXINE HCL ER 37.5 MG: 37.5 | 30 days supply | Qty: 60 | Fill #0

## 2018-06-15 ENCOUNTER — Ambulatory Visit: Payer: No Typology Code available for payment source | Admitting: Family Medicine

## 2018-06-18 ENCOUNTER — Ambulatory Visit (INDEPENDENT_AMBULATORY_CARE_PROVIDER_SITE_OTHER): Payer: No Typology Code available for payment source | Admitting: Family Medicine

## 2018-06-18 ENCOUNTER — Encounter: Payer: Self-pay | Admitting: Family Medicine

## 2018-06-18 VITALS — BP 120/70 | HR 88 | Temp 97.8°F | Ht 66.0 in | Wt 311.1 lb

## 2018-06-18 DIAGNOSIS — F329 Major depressive disorder, single episode, unspecified: Secondary | ICD-10-CM

## 2018-06-18 DIAGNOSIS — F32A Depression, unspecified: Secondary | ICD-10-CM

## 2018-06-18 DIAGNOSIS — F419 Anxiety disorder, unspecified: Secondary | ICD-10-CM

## 2018-06-18 NOTE — Patient Instructions (Addendum)
Let me know if you are interested in coming off of the medication.   When you need a refill, please remind [place] that you want 30 day supplies.  Stay active!  Let us know if you need anything.

## 2018-06-18 NOTE — Progress Notes (Signed)
Chief Complaint  Patient presents with  . Follow-up    Subjective Diane Rivera presents for f/u anxiety/depression.  She is currently being treated with Effexor 75 mg/d.  Reports good improvement since treatment. No thoughts of harming self or others. No self-medication with alcohol, prescription drugs or illicit drugs. Pt is not following with a counselor/psychologist.  ROS Psych: No homicidal or suicidal thoughts  Past Medical History:  Diagnosis Date  . Depression    MODERATE PP DEPRESSIONS meds for 6 mos  . Hypertension   . Migraines      Exam BP 120/70 (BP Location: Left Arm, Patient Position: Sitting, Cuff Size: Large)   Pulse 88   Temp 97.8 F (36.6 C) (Oral)   Ht 5\' 6"  (1.676 m)   Wt (!) 311 lb 2 oz (141.1 kg)   SpO2 98%   BMI 50.22 kg/m  General:  well developed, well nourished, in no apparent distress Lungs:  clear to auscultation, breath sounds equal bilaterally, no respiratory distress Cardio:  regular rate and rhythm without murmurs, heart sounds without clicks or rubs Psych: well oriented with normal range of affect and age-appropriate judgement/insight, alert and oriented x4.  Assessment and Plan  Anxiety and depression  Cont Effexor. Doing well. Will defer to her judgment for seeing counseling. Planted mental seed of coming off in future, will reassess at f/u. F/u in 6 mo. The patient voiced understanding and agreement to the plan.  Jilda Roche [place], DO 06/18/18 7:17 AM

## 2018-06-18 NOTE — Progress Notes (Signed)
Pre visit review using our clinic review tool, if applicable. No additional management support is needed unless otherwise documented below in the visit note. 

## 2018-08-03 ENCOUNTER — Other Ambulatory Visit: Payer: Self-pay | Admitting: Family Medicine

## 2018-08-03 ENCOUNTER — Telehealth: Payer: Self-pay | Admitting: Family Medicine

## 2018-08-03 DIAGNOSIS — M5489 Other dorsalgia: Secondary | ICD-10-CM

## 2018-08-03 NOTE — Telephone Encounter (Signed)
OK 

## 2018-08-03 NOTE — Telephone Encounter (Signed)
Copied from CRM [phone]. Topic: Referral - Request for Referral >> Aug 03, 2018 12:38 PM Gean BirchwoodWilliams-Neal, Sade R wrote: Has patient seen PCP for this complaint? Yes Referral for which specialty:Chiropractor Preferred provider/office: [facility] Reason for referral: Back Pain    Referral done

## 2018-08-04 MED FILL — VENLAFAXINE HCL ER 37.5 MG: 37.5 | 30 days supply | Qty: 60 | Fill #1

## 2019-10-21 ENCOUNTER — Emergency Department (INDEPENDENT_AMBULATORY_CARE_PROVIDER_SITE_OTHER): Payer: No Typology Code available for payment source

## 2019-10-21 ENCOUNTER — Other Ambulatory Visit: Payer: Self-pay

## 2019-10-21 ENCOUNTER — Encounter: Payer: Self-pay | Admitting: Emergency Medicine

## 2019-10-21 ENCOUNTER — Emergency Department
Admission: EM | Admit: 2019-10-21 | Discharge: 2019-10-21 | Disposition: A | Discharge status: Home / Self Care | Payer: No Typology Code available for payment source | Source: Home / Self Care | Attending: Family Medicine | Admitting: Family Medicine

## 2019-10-21 DIAGNOSIS — M25571 Pain in right ankle and joints of right foot: Secondary | ICD-10-CM

## 2019-10-21 DIAGNOSIS — S93401A Sprain of unspecified ligament of right ankle, initial encounter: Secondary | ICD-10-CM

## 2019-10-21 NOTE — ED Provider Notes (Addendum)
[facility]    CSN: [id number] Arrival date & time: 10/21/19  1729      History   Chief Complaint Chief Complaint  Patient presents with  . Ankle Pain    HPI Diane Rivera is a 29 y.o. female.   Patient inverted her right ankle about two hours ago, resulting in an audible popping sound followed by immediate pain/swelling. She has past history of right ankle fracture 15 years ago requiring ORIF.  The history is provided by the patient.  Ankle Pain Location:  Ankle Time since incident:  2 hours Injury: yes   Mechanism of injury comment:  Inverted ankle Ankle location:  R ankle Pain details:    Quality:  Aching and throbbing   Radiates to:  Does not radiate   Severity:  Severe   Onset quality:  Sudden   Duration:  2 hours   Timing:  Constant   Progression:  Unchanged Chronicity:  New Prior injury to area:  Yes Relieved by:  None tried Worsened by:  Bearing weight Ineffective treatments:  None tried Associated symptoms: decreased ROM, stiffness and swelling   Associated symptoms: no muscle weakness, no numbness and no tingling   Risk factors: obesity     Past Medical History:  Diagnosis Date  . Depression    MODERATE PP DEPRESSIONS meds for 6 mos  . Hypertension   . Migraines     Patient Active Problem List   Diagnosis Date Noted  . Anxiety and depression 04/27/2018  . Morbid obesity ([place]) 04/27/2018  . Pregnant 07/03/2017    Past Surgical History:  Procedure Laterality Date  . ANKLE SURGERY      OB History    Gravida  2   Para  2   Term  2   Preterm      AB      Living  2     SAB      TAB      Ectopic      Multiple  0   Live Births  2            Home Medications    Prior to Admission medications   Medication Sig Start Date End Date Taking? Authorizing Provider  ketoconazole (NIZORAL) 2 % cream Apply 1 application topically daily. 04/27/18   Shelda Pal, DO  venlafaxine XR (EFFEXOR-XR) 75 MG 24  hr capsule Take 1 capsule (75 mg total) by mouth daily with breakfast. 04/27/18   Wendling, Crosby Oyster, DO    Family History Family History  Problem Relation Age of Onset  . Hypertension Father   . Lung cancer Maternal Grandmother     Social History Social History   Tobacco Use  . Smoking status: Never Smoker  . Smokeless tobacco: Never Used  Substance Use Topics  . Alcohol use: No  . Drug use: No     Allergies   Patient has no known allergies.   Review of Systems Review of Systems  Musculoskeletal: Positive for stiffness.  All other systems reviewed and are negative.    Physical Exam Triage Vital Signs ED Triage Vitals  Enc Vitals Group     BP 10/21/19 1745 127/83     Pulse Rate 10/21/19 1745 (!) 119     Resp --      Temp 10/21/19 1745 98.8 F (37.1 C)     Temp Source 10/21/19 1745 Oral     SpO2 10/21/19 1745 97 %     Weight 10/21/19  1746 300 lb (136.1 kg)     Height 10/21/19 1746 5\' 6"  (1.676 m)     Head Circumference --      Peak Flow --      Pain Score 10/21/19 1746 7     Pain Loc --      Pain Edu? --      Excl. in GC? --    No data found.  Updated Vital Signs BP 127/83 (BP Location: Right Arm)   Pulse (!) 119   Temp 98.8 F (37.1 C) (Oral)   Ht 5\' 6"  (1.676 m)   Wt 136.1 kg   LMP 10/20/2019 (Exact Date)   SpO2 97%   BMI 48.42 kg/m   Visual Acuity Right Eye Distance:   Left Eye Distance:   Bilateral Distance:    Right Eye Near:   Left Eye Near:    Bilateral Near:     Physical Exam Vitals and nursing note reviewed.  Constitutional:      General: She is not in acute distress.    Appearance: She is obese. She is not ill-appearing.  HENT:     Head: Atraumatic.  Eyes:     Pupils: Pupils are equal, round, and reactive to light.  Cardiovascular:     Rate and Rhythm: Tachycardia present.  Pulmonary:     Effort: Pulmonary effort is normal.  Musculoskeletal:     Right ankle: Swelling present. No deformity, ecchymosis or  lacerations. Tenderness present over the lateral malleolus. No base of 5th metatarsal tenderness. Decreased range of motion. Normal pulse.     Right Achilles Tendon: Normal.       Feet:     Comments: Right ankle joint stable  Skin:    General: Skin is warm and dry.  Neurological:     Mental Status: She is alert.      UC Treatments / Results  Labs (all labs ordered are listed, but only abnormal results are displayed) Labs Reviewed - No data to display  EKG   Radiology DG Ankle Complete Right  Result Date: 10/21/2019 CLINICAL DATA:  29 year female with acute right ankle pain. EXAM: RIGHT ANKLE - COMPLETE 3+ VIEW COMPARISON:  Right foot radiograph dated 07/01/2016. FINDINGS: There is no acute fracture or dislocation. Prior distal tibial fixation pin again noted which appears intact. No evidence of loosening. The bones are well mineralized. No arthritic changes. The ankle mortise is intact. There is mild soft tissue swelling over the lateral malleolus. IMPRESSION: No acute fracture or dislocation. Electronically Signed   By: Twenty-seven M.D.   On: 10/21/2019 18:24    Procedures Procedures (including critical care time)  Medications Ordered in UC Medications - No data to display  Initial Impression / Assessment and Plan / UC Course  I have reviewed the triage vital signs and the nursing notes.  Pertinent labs & imaging results that were available during my care of the patient were reviewed by me and considered in my medical decision making (see chart for details).    Ace wrap applied.  Dispensed crutches and AirCast stirrup splint. Followup with Dr. Elgie Collard ([facility]) in about two weeks.   Final Clinical Impressions(s) / UC Diagnoses   Final diagnoses:  Acute right ankle pain  Sprain of right ankle, unspecified ligament, initial encounter     Discharge Instructions     Apply ice pack for 30 minutes every 1 to 2 hours today and  tomorrow.  Elevate.  Use crutches for  about one week.  Wear Ace wrap until swelling decreases.  Wear brace for about 2 to 3 weeks.  Begin range of motion and stretching exercises in about 5 days as per instruction sheet.  May take Ibuprofen 200mg , 4 tabs every 8 hours with food. May take Tylenol as needed for pain.    ED Prescriptions    None        , MD 10/21/19 1821    12/19/19, MD 10/21/19 [id number]

## 2019-10-21 NOTE — ED Triage Notes (Signed)
Patient states she was helping her daughter with gymnastics and rolled her right ankle.  Patient heard something "pop."  Some swelling and pain.

## 2019-10-21 NOTE — Discharge Instructions (Addendum)
Apply ice pack for 30 minutes every 1 to 2 hours today and tomorrow.  Elevate.  Use crutches for about one week.  Wear Ace wrap until swelling decreases.  Wear brace for about 2 to 3 weeks.  Begin range of motion and stretching exercises in about 5 days as per instruction sheet.  May take Ibuprofen 200mg , 4 tabs every 8 hours with food. May take Tylenol as needed for pain.

## 2019-10-22 ENCOUNTER — Telehealth: Payer: Self-pay | Admitting: Family Medicine

## 2019-10-22 NOTE — Telephone Encounter (Signed)
Patient states needs a referral to a Sport Medicine doctor due a due a sprain ankle.   Doctor the urgent care referred patient to is :   [facility]

## 2019-10-25 ENCOUNTER — Other Ambulatory Visit: Payer: Self-pay | Admitting: Family Medicine

## 2019-10-25 DIAGNOSIS — S93401A Sprain of unspecified ligament of right ankle, initial encounter: Secondary | ICD-10-CM

## 2019-10-25 NOTE — Telephone Encounter (Signed)
That's fine

## 2019-10-25 NOTE — Telephone Encounter (Signed)
Referral done as requested

## 2019-10-28 ENCOUNTER — Ambulatory Visit (INDEPENDENT_AMBULATORY_CARE_PROVIDER_SITE_OTHER): Payer: No Typology Code available for payment source | Admitting: Sports Medicine

## 2019-10-28 ENCOUNTER — Other Ambulatory Visit: Payer: Self-pay

## 2019-10-28 ENCOUNTER — Encounter: Payer: Self-pay | Admitting: Sports Medicine

## 2019-10-28 DIAGNOSIS — S93411A Sprain of calcaneofibular ligament of right ankle, initial encounter: Secondary | ICD-10-CM

## 2019-10-28 DIAGNOSIS — S93401A Sprain of unspecified ligament of right ankle, initial encounter: Secondary | ICD-10-CM | POA: Insufficient documentation

## 2019-10-28 NOTE — Progress Notes (Signed)
    Procedures performed today:    None.  Independent interpretation of tests performed by another provider:   I personally reviewed her x-rays, they show a syndesmotic screw without complications, no other fractures.  Impression and Recommendations:    Sprain of ankle, right Approximately 1 week ago this pleasant 29 year old female inverted her right ankle, she had pain, swelling. She was seen in urgent care where x-rays showed no evidence of fracture, she does have a syndesmotic screw in place. She is referred to me for further evaluation. Pain has improved, swelling has improved, she has a bit of tenderness posterior and inferior to the tip of the fibula. Otherwise exam is benign, ankle is stable. Continue Aircast, rehab exercises given, may use over-the-counter NSAIDs and analgesics as needed, return in 2 weeks. This is an acute injury with an unknown clear prognosis that is not at goal.    ___________________________________________ Ihor Austin. Benjamin Stain, M.D., ABFM., CAQSM. Primary Care and Sports Medicine [place] [facility]  Adjunct Instructor of Family Medicine  [employer]

## 2019-10-28 NOTE — Assessment & Plan Note (Addendum)
Approximately 1 week ago this pleasant 29 year old female inverted her right ankle, she had pain, swelling. She was seen in urgent care where x-rays showed no evidence of fracture, she does have a syndesmotic screw in place. She is referred to me for further evaluation. Pain has improved, swelling has improved, she has a bit of tenderness posterior and inferior to the tip of the fibula. Otherwise exam is benign, ankle is stable. Continue Aircast, rehab exercises given, may use over-the-counter NSAIDs and analgesics as needed, return in 2 weeks. This is an acute injury with an unknown clear prognosis that is not at goal.

## 2019-11-04 ENCOUNTER — Ambulatory Visit: Payer: No Typology Code available for payment source | Admitting: Sports Medicine

## 2019-11-08 ENCOUNTER — Ambulatory Visit (INDEPENDENT_AMBULATORY_CARE_PROVIDER_SITE_OTHER): Payer: No Typology Code available for payment source | Admitting: Sports Medicine

## 2019-11-08 ENCOUNTER — Other Ambulatory Visit: Payer: Self-pay

## 2019-11-08 ENCOUNTER — Encounter: Payer: Self-pay | Admitting: Sports Medicine

## 2019-11-08 DIAGNOSIS — S93411D Sprain of calcaneofibular ligament of right ankle, subsequent encounter: Secondary | ICD-10-CM

## 2019-11-08 NOTE — Progress Notes (Signed)
    Procedures performed today:    None.  Independent interpretation of tests performed by another provider:   None.  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 Approximately 2-1/2 weeks ago she had an inversion injury of her right ankle, pain and swelling, x-rays showed no fracture, she does have a syndesmotic screw from an old injury. Today pain and swelling continues to improve, she is on seated duty only, today I filled out her disability paperwork. She is not comfortable in the stirrup Aircast so we will transition her into a lace up ASO. Continue seated duty, I am going to write her for the next month. Return to see me in approximately a month.    ___________________________________________ Diane Rivera. Diane Rivera, M.D., ABFM., CAQSM. Primary Care and Sports Medicine [facility]  Adjunct Instructor of Family Medicine  [employer]

## 2019-11-08 NOTE — Assessment & Plan Note (Signed)
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 Approximately 2-1/2 weeks ago she had an inversion injury of her right ankle, pain and swelling, x-rays showed no fracture, she does have a syndesmotic screw from an old injury. Today pain and swelling continues to improve, she is on seated duty only, today I filled out her disability paperwork. She is not comfortable in the stirrup Aircast so we will transition her into a lace up ASO. Continue seated duty, I am going to write her for the next month. Return to see me in approximately a month.

## 2019-11-11 ENCOUNTER — Ambulatory Visit: Payer: No Typology Code available for payment source | Admitting: Sports Medicine

## 2019-12-06 ENCOUNTER — Ambulatory Visit: Payer: No Typology Code available for payment source | Admitting: Sports Medicine

## 2019-12-14 ENCOUNTER — Ambulatory Visit (INDEPENDENT_AMBULATORY_CARE_PROVIDER_SITE_OTHER): Payer: No Typology Code available for payment source | Admitting: Sports Medicine

## 2019-12-14 ENCOUNTER — Other Ambulatory Visit: Payer: Self-pay

## 2019-12-14 DIAGNOSIS — S93411D Sprain of calcaneofibular ligament of right ankle, subsequent encounter: Secondary | ICD-10-CM

## 2019-12-14 NOTE — Assessment & Plan Note (Signed)
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 Approximately a month and 1/2 to 2 months ago she had an inversion injury of her right ankle, with signs consistent with a calcaneofibular ligament injury. She also had a syndesmotic screw from an old injury. She returns today completely pain-free, she is out of the ASO, she is working full duty, return to see me as needed.

## 2019-12-14 NOTE — Progress Notes (Signed)
    Procedures performed today:    None.  Independent interpretation of notes and tests performed by another provider:   None.  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 Approximately a month and 1/2 to 2 months ago she had an inversion injury of her right ankle, with signs consistent with a calcaneofibular ligament injury. She also had a syndesmotic screw from an old injury. She returns today completely pain-free, she is out of the ASO, she is working full duty, return to see me as needed.    ___________________________________________ Ihor Austin. Benjamin Stain, M.D., ABFM., CAQSM. Primary Care and Sports Medicine  [facility]  Adjunct Instructor of Family Medicine  [employer]

## 2020-01-17 ENCOUNTER — Other Ambulatory Visit: Payer: Self-pay

## 2020-01-17 ENCOUNTER — Encounter: Payer: Self-pay | Admitting: Family Medicine

## 2020-01-17 ENCOUNTER — Ambulatory Visit (INDEPENDENT_AMBULATORY_CARE_PROVIDER_SITE_OTHER): Payer: No Typology Code available for payment source | Admitting: Family Medicine

## 2020-01-17 ENCOUNTER — Other Ambulatory Visit (INDEPENDENT_AMBULATORY_CARE_PROVIDER_SITE_OTHER): Payer: No Typology Code available for payment source

## 2020-01-17 VITALS — BP 120/72 | HR 93 | Temp 96.6°F | Ht 66.0 in | Wt 314.1 lb

## 2020-01-17 DIAGNOSIS — Z0000 Encounter for general adult medical examination without abnormal findings: Secondary | ICD-10-CM

## 2020-01-17 DIAGNOSIS — R739 Hyperglycemia, unspecified: Secondary | ICD-10-CM

## 2020-01-17 DIAGNOSIS — F419 Anxiety disorder, unspecified: Secondary | ICD-10-CM | POA: Diagnosis not present

## 2020-01-17 DIAGNOSIS — F329 Major depressive disorder, single episode, unspecified: Secondary | ICD-10-CM

## 2020-01-17 DIAGNOSIS — F32A Depression, unspecified: Secondary | ICD-10-CM

## 2020-01-17 LAB — COMPREHENSIVE METABOLIC PANEL
ALT: 14 U/L (ref 0–35)
AST: 12 U/L (ref 0–37)
Albumin: 4 g/dL (ref 3.5–5.2)
Alkaline Phosphatase: 73 U/L (ref 39–117)
BUN: 12 mg/dL (ref 6–23)
CO2: 26 mEq/L (ref 19–32)
Calcium: 8.9 mg/dL (ref 8.4–10.5)
Chloride: 105 mEq/L (ref 96–112)
Creatinine, Ser: 0.81 mg/dL (ref 0.40–1.20)
GFR: 84.6 mL/min (ref >60.00)
Glucose, Bld: 109 mg/dL — ABNORMAL HIGH (ref 70–99)
Potassium: 4.2 mEq/L (ref 3.5–5.1)
Sodium: 138 mEq/L (ref 135–145)
Total Bilirubin: 0.4 mg/dL (ref 0.2–1.2)
Total Protein: 6.6 g/dL (ref 6.0–8.3)

## 2020-01-17 LAB — LIPID PANEL
Cholesterol: 171 mg/dL (ref 0–200)
HDL: 54.7 mg/dL (ref >39.00)
LDL Cholesterol: 90 mg/dL (ref 0–99)
NonHDL: 116.65
Total CHOL/HDL Ratio: 3
Triglycerides: 135 mg/dL (ref 0.0–149.0)
VLDL: 27 mg/dL (ref 0.0–40.0)

## 2020-01-17 LAB — CBC
HCT: 37.8 % (ref 36.0–46.0)
Hemoglobin: 12.5 g/dL (ref 12.0–15.0)
MCHC: 33 g/dL (ref 30.0–36.0)
MCV: 83.6 fl (ref 78.0–100.0)
Platelets: 251 10*3/uL (ref 150.0–400.0)
RBC: 4.52 Mil/uL (ref 3.87–5.11)
RDW: 15.2 % (ref 11.5–15.5)
WBC: 6.1 10*3/uL (ref 4.0–10.5)

## 2020-01-17 LAB — HEMOGLOBIN A1C: Hgb A1c MFr Bld: 5.4 % (ref 4.6–6.5)

## 2020-01-17 MED ORDER — VENLAFAXINE HCL ER 75 MG PO CP24: 75.0000 mg | ORAL_CAPSULE | Freq: Every day | ORAL | 2 refills | Status: DC

## 2020-01-17 MED FILL — VENLAFAXINE HCL ER 75 MG CA: 75 | 30 days supply | Qty: 30 | Fill #0

## 2020-01-17 NOTE — Patient Instructions (Addendum)
Give [place] 2-3 business days to get the results of your labs back.   Keep the diet clean and stay active.   Send me a message if GI side effects are too bothersome and we will send in a lower dosage.  Goal Weight in 6 months: 290-300 lbs; Take a side profile and face on profile pic in your underwear monthly to keep track of progress and accountability.   Let us know if you need anything.

## 2020-01-17 NOTE — Progress Notes (Signed)
Chief Complaint  Patient presents with  . Annual Exam     Well Woman Diane Rivera is here for a complete physical.   Her last physical was >1 year ago.  Current diet: in general, diet could be better. Current exercise: walking Contraception? No Weight gain? Yes Seatbelt? Yes  Health Maintenance Pap/HPV- Yes Tetanus- Yes HIV screening- Yes  Past Medical History:  Diagnosis Date  . Depression    MODERATE PP DEPRESSIONS meds for 6 mos  . Hypertension   . Migraines      Past Surgical History:  Procedure Laterality Date  . ANKLE SURGERY      Medications  Takes no meds routinely.    Allergies No Known Allergies  Review of Systems: Constitutional:  no unexpected weight changes Eye:  no recent significant change in vision Ear/Nose/Mouth/Throat:  Ears:  no tinnitus or vertigo and no recent change in hearing Nose/Mouth/Throat:  no complaints of nasal congestion, no sore throat Cardiovascular: no chest pain Respiratory:  no cough and no shortness of breath Gastrointestinal:  no abdominal pain, no change in bowel habits GU:  Female: negative for dysuria or pelvic pain Musculoskeletal/Extremities:  no pain of the joints Integumentary (Skin/Breast):  no abnormal skin lesions reported Neurologic:  no headaches Endocrine:  denies fatigue Psych: +anxiety  Exam BP 120/72 (BP Location: Left Arm, Patient Position: Sitting, Cuff Size: Large)   Pulse 93   Temp (!) 96.6 F (35.9 C) (Temporal)   Ht 5\' 6"  (1.676 m)   Wt (!) 314 lb 2 oz (142.5 kg)   SpO2 97%   BMI 50.70 kg/m  General:  well developed, well nourished, in no apparent distress Skin:  no significant moles, warts, or growths Head:  no masses, lesions, or tenderness Eyes:  pupils equal and round, sclera anicteric without injection Ears:  canals without lesions, TMs shiny without retraction, no obvious effusion, no erythema Nose:  nares patent, septum midline, mucosa normal, and no drainage or sinus  tenderness Throat/Pharynx:  lips and gingiva without lesion; tongue and uvula midline; non-inflamed pharynx; no exudates or postnasal drainage Neck: neck supple without adenopathy, thyromegaly, or masses Lungs:  clear to auscultation, breath sounds equal bilaterally, no respiratory distress Cardio:  regular rate and rhythm, no bruits, no LE edema Abdomen:  abdomen soft, nontender; bowel sounds normal; no masses or organomegaly Genital: Defer to GYN Musculoskeletal:  symmetrical muscle groups noted without atrophy or deformity Extremities:  no clubbing, cyanosis, or edema, no deformities, no skin discoloration Neuro:  gait normal; deep tendon reflexes normal and symmetric Psych: well oriented with normal range of affect and appropriate judgment/insight  Assessment and Plan  Well adult exam - Plan: CBC, Comprehensive metabolic panel, Lipid panel  Anxiety and depression - Plan: venlafaxine XR (EFFEXOR-XR) 75 MG 24 hr capsule  Morbid obesity ([facility])   Well 29 y.o. female. Counseled on diet and exercise. Take profile pics to track progress monthly. Goal weight in 6 mo is 290-300 lbs.  Other orders as above. Restart SNRI. She will let 34 know if she has AE's in first 6 weeks. If not doing well, I will see her in 6 weeks, if doing well I will see her in 6 months.  Follow up 6 mo. The patient voiced understanding and agreement to the plan.  [place], DO 01/17/20 7:26 AM

## 2020-02-15 ENCOUNTER — Other Ambulatory Visit: Payer: Self-pay | Admitting: Family Medicine

## 2020-02-15 MED ORDER — VENLAFAXINE HCL ER 150 MG PO TB24: 150.0000 mg | ORAL_TABLET | Freq: Every day | ORAL | 5 refills | Status: DC

## 2020-02-15 MED FILL — VENLAFAXINE HCL ER 150 MG T: 150 | 30 days supply | Qty: 30 | Fill #0

## 2020-03-21 MED FILL — VENLAFAXINE HCL ER 150 MG T: 150 | 30 days supply | Qty: 30 | Fill #1

## 2020-04-18 MED FILL — VENLAFAXINE HCL ER 150 MG T: 150 | 30 days supply | Qty: 30 | Fill #2

## 2020-05-18 MED FILL — VENLAFAXINE HCL ER 150 MG T: 150 | 30 days supply | Qty: 30 | Fill #3

## 2020-06-21 MED FILL — VENLAFAXINE HCL ER 150 MG T: 150 | 30 days supply | Qty: 30 | Fill #4

## 2020-06-23 ENCOUNTER — Other Ambulatory Visit: Payer: Self-pay | Admitting: Family Medicine

## 2020-06-23 MED ORDER — VENLAFAXINE HCL ER 150 MG PO CP24: 150.0000 mg | ORAL_CAPSULE | Freq: Every day | ORAL | 2 refills | Status: DC

## 2020-06-23 MED FILL — VENLAFAXINE HCL ER 150 MG C: 150 | 30 days supply | Qty: 30 | Fill #0

## 2020-07-19 ENCOUNTER — Ambulatory Visit: Payer: No Typology Code available for payment source | Admitting: Family Medicine

## 2020-07-19 DIAGNOSIS — Z0289 Encounter for other administrative examinations: Secondary | ICD-10-CM

## 2020-07-20 MED FILL — VENLAFAXINE HCL ER 150 MG C: 150 | 30 days supply | Qty: 30 | Fill #1

## 2020-08-21 MED FILL — VENLAFAXINE HCL ER 150 MG C: 150 | 30 days supply | Qty: 30 | Fill #2

## 2020-08-29 ENCOUNTER — Ambulatory Visit: Payer: No Typology Code available for payment source

## 2020-09-19 ENCOUNTER — Other Ambulatory Visit: Payer: Self-pay | Admitting: Family Medicine

## 2020-09-19 MED FILL — VENLAFAXINE HCL ER 150 MG C: 150 | 30 days supply | Qty: 30 | Fill #0

## 2020-09-28 ENCOUNTER — Encounter: Payer: Self-pay | Admitting: Family Medicine

## 2020-09-28 ENCOUNTER — Other Ambulatory Visit: Payer: Self-pay

## 2020-09-28 ENCOUNTER — Other Ambulatory Visit (HOSPITAL_COMMUNITY)
Admission: RE | Admit: 2020-09-28 | Discharge: 2020-09-28 | Disposition: A | Discharge status: Home / Self Care | Payer: No Typology Code available for payment source | Source: Ambulatory Visit | Attending: Family Medicine | Admitting: Family Medicine

## 2020-09-28 ENCOUNTER — Ambulatory Visit: Payer: No Typology Code available for payment source | Admitting: Family Medicine

## 2020-09-28 VITALS — BP 114/78 | HR 96 | Ht 66.0 in | Wt 308.0 lb

## 2020-09-28 DIAGNOSIS — Z7721 Contact with and (suspected) exposure to potentially hazardous body fluids: Secondary | ICD-10-CM

## 2020-09-28 DIAGNOSIS — Z01419 Encounter for gynecological examination (general) (routine) without abnormal findings: Secondary | ICD-10-CM

## 2020-09-28 DIAGNOSIS — R102 Pelvic and perineal pain: Secondary | ICD-10-CM | POA: Diagnosis not present

## 2020-09-28 NOTE — Progress Notes (Signed)
GYNECOLOGY ANNUAL PREVENTATIVE CARE ENCOUNTER NOTE  Subjective:   Diane Rivera is a 30 y.o. G12P2002 female here for a routine annual gynecologic exam.  Current complaints: right sided abdominal pain. Occurs intermittently and usually lasts for a day or so. Occurs randomly during the month - not paired with particular time in cycle and occurs before menses, after menses, etc. Ibuprofen and tylenol help with pain. Normal menses about every 30 days - moderate flow for 4-5 days. Not currently on birth control. History of 2 SVD. Denies abnormal vaginal bleeding, discharge, pelvic pain, problems with intercourse or other gynecologic concerns.    Gynecologic History Patient's last menstrual period was 09/07/2020. Patient is sexually active  Contraception: condoms Last Pap: 2018. Results were: normal Last mammogram: n/a.   Obstetric History OB History  Gravida Para Term Preterm AB Living  _0 SAB IAB Ectopic Multiple Live Births        0 2    # Outcome Date GA Lbr Len/2nd Weight Sex Delivery Anes PTL Lv  2 Term 07/03/17 [id number]:10 / 00:20 7 lb 14 oz (3.572 kg) F Vag-Spont EPI  LIV  1 Term 2014 452w0d F  EPI  LIV    Past Medical History:  Diagnosis Date  . Depression    MODERATE PP DEPRESSIONS meds for 6 mos  . Hypertension   . Migraines     Past Surgical History:  Procedure Laterality Date  . ANKLE SURGERY      Current Outpatient Medications on File Prior to Visit  Medication Sig Dispense Refill  . venlafaxine XR (EFFEXOR-XR) 150 MG 24 hr capsule TAKE 1 CAPSULE (150 MG TOTAL) BY MOUTH DAILY WITH BREAKFAST. 30 capsule 2   No current facility-administered medications on file prior to visit.    No Known Allergies  Social History   Socioeconomic History  . Marital status: Married    Spouse name: Not on file  . Number of children: Not on file  . Years of education: Not on file  . Highest education level: Not on file  Occupational History  . Not on file   Tobacco Use  . Smoking status: Former SmResearch scientist (life sciences). Smokeless tobacco: Never Used  Substance and Sexual Activity  . Alcohol use: Yes  . Drug use: No  . Sexual activity: Yes    Birth control/protection: None  Other Topics Concern  . Not on file  Social History Narrative  . Not on file   Social Determinants of Health   Financial Resource Strain: Not on file  Food Insecurity: Not on file  Transportation Needs: Not on file  Physical Activity: Not on file  Stress: Not on file  Social Connections: Not on file  Intimate Partner Violence: Not on file    Family History  Problem Relation Age of Onset  . Hypertension Father   . Lung cancer Maternal Grandmother   . Cancer Maternal Grandfather        colon    The following portions of the patient's history were reviewed and updated as appropriate: allergies, current medications, past family history, past medical history, past social history, past surgical history and problem list.  Review of Systems Pertinent items are noted in HPI.   Objective:  BP 114/78   Pulse 96   Ht 5' 6" (1.676 m)   Wt (!) 308 lb (139.7 kg)   LMP 09/07/2020   BMI 49.71 kg/m  Wt Readings from Last  3 Encounters:  09/28/20 (!) 308 lb (139.7 kg)  01/17/20 (!) 314 lb 2 oz (142.5 kg)  10/28/19 300 lb (136.1 kg)     Chaperone present during exam  CONSTITUTIONAL: Well-developed, well-nourished female in no acute distress.  HENT:  Normocephalic, atraumatic, External right and left ear normal. Oropharynx is clear and moist EYES: Conjunctivae and EOM are normal. Pupils are equal, round, and reactive to light. No scleral icterus.  NECK: Normal range of motion, supple, no masses.  Normal thyroid.   CARDIOVASCULAR: Normal heart rate noted, regular rhythm RESPIRATORY: Clear to auscultation bilaterally. Effort and breath sounds normal, no problems with respiration noted. BREASTS: Symmetric in size. No masses, skin changes, nipple drainage, or  lymphadenopathy. ABDOMEN: Soft, normal bowel sounds, no distention noted.  No tenderness, rebound or guarding.  PELVIC: Normal appearing external genitalia; normal appearing vaginal mucosa and cervix.  No abnormal discharge noted.  Normal uterine size, no other palpable masses, no uterine or adnexal tenderness. MUSCULOSKELETAL: Normal range of motion. No tenderness.  No cyanosis, clubbing, or edema.  2+ distal pulses. SKIN: Skin is warm and dry. No rash noted. Not diaphoretic. No erythema. No pallor. NEUROLOGIC: Alert and oriented to person, place, and time. Normal reflexes, muscle tone coordination. No cranial nerve deficit noted. PSYCHIATRIC: Normal mood and affect. Normal behavior. Normal judgment and thought content.  Assessment:  Annual gynecologic examination with pap smear   Plan:  1. Well Woman Exam Will follow up results of pap smear and manage accordingly. Mammogram scheduled STD testing discussed. Patient requested testing - Cytology - PAP( [place]) - HIV antibody (with reflex) - RPR - Hepatitis C Antibody - Hepatitis B Surface AntiGEN - Comp Met ([facility]) - CBC - Hemoglobin A1c - TSH  2. Morbid obesity ([place])  3. Exposure to body fluid - HIV antibody (with reflex) - RPR - Hepatitis C Antibody - Hepatitis B Surface AntiGEN  4. Pelvic pain - US PELVIC COMPLETE WITH TRANSVAGINAL; Future   Routine preventative health maintenance measures emphasized. Please refer to After Visit Summary for other counseling recommendations.    Loma Boston, [place] for [employer]

## 2020-09-29 LAB — CBC
Hematocrit: 38.9 % (ref 34.0–46.6)
Hemoglobin: 13.2 g/dL (ref 11.1–15.9)
MCH: 28.6 pg (ref 26.6–33.0)
MCHC: 33.9 g/dL (ref 31.5–35.7)
MCV: 84 fL (ref 79–97)
Platelets: 293 10*3/uL (ref 150–450)
RBC: 4.61 x10E6/uL (ref 3.77–5.28)
RDW: 13 % (ref 11.7–15.4)
WBC: 5.9 10*3/uL (ref 3.4–10.8)

## 2020-09-29 LAB — COMPREHENSIVE METABOLIC PANEL
ALT: 16 IU/L (ref 0–32)
AST: 15 IU/L (ref 0–40)
Albumin/Globulin Ratio: 1.8 (ref 1.2–2.2)
Albumin: 4.4 g/dL (ref 3.9–5.0)
Alkaline Phosphatase: 83 IU/L (ref 44–121)
BUN/Creatinine Ratio: 15 (ref 9–23)
BUN: 12 mg/dL (ref 6–20)
Bilirubin Total: 0.3 mg/dL (ref 0.0–1.2)
CO2: 22 mmol/L (ref 20–29)
Calcium: 9.2 mg/dL (ref 8.7–10.2)
Chloride: 105 mmol/L (ref 96–106)
Creatinine, Ser: 0.82 mg/dL (ref 0.57–1.00)
GFR calc Af Amer: 113 mL/min/{1.73_m2} (ref >59)
GFR calc non Af Amer: 98 mL/min/{1.73_m2} (ref >59)
Globulin, Total: 2.4 g/dL (ref 1.5–4.5)
Glucose: 95 mg/dL (ref 65–99)
Potassium: 4.4 mmol/L (ref 3.5–5.2)
Sodium: 142 mmol/L (ref 134–144)
Total Protein: 6.8 g/dL (ref 6.0–8.5)

## 2020-09-29 LAB — HEPATITIS B SURFACE ANTIGEN: Hepatitis B Surface Ag: NEGATIVE

## 2020-09-29 LAB — CYTOLOGY - PAP
Chlamydia: NEGATIVE
Comment: NEGATIVE
Comment: NORMAL
Diagnosis: NEGATIVE
Neisseria Gonorrhea: NEGATIVE

## 2020-09-29 LAB — HEMOGLOBIN A1C
Est. average glucose Bld gHb Est-mCnc: 108 mg/dL
Hgb A1c MFr Bld: 5.4 % (ref 4.8–5.6)

## 2020-09-29 LAB — TSH: TSH: 1.25 u[IU]/mL (ref 0.450–4.500)

## 2020-09-29 LAB — RPR: RPR Ser Ql: NONREACTIVE

## 2020-09-29 LAB — HIV ANTIBODY (ROUTINE TESTING W REFLEX): HIV Screen 4th Generation wRfx: NONREACTIVE

## 2020-09-29 LAB — HEPATITIS C ANTIBODY: Hep C Virus Ab: <0.1 s/co ratio (ref 0.0–0.9)

## 2020-10-04 ENCOUNTER — Other Ambulatory Visit: Payer: Self-pay

## 2020-10-04 ENCOUNTER — Ambulatory Visit (HOSPITAL_BASED_OUTPATIENT_CLINIC_OR_DEPARTMENT_OTHER)
Admission: RE | Admit: 2020-10-04 | Discharge: 2020-10-04 | Disposition: A | Discharge status: Home / Self Care | Payer: No Typology Code available for payment source | Source: Ambulatory Visit | Attending: Family Medicine | Admitting: Family Medicine

## 2020-10-04 DIAGNOSIS — R102 Pelvic and perineal pain: Secondary | ICD-10-CM | POA: Insufficient documentation

## 2020-10-11 ENCOUNTER — Encounter: Payer: Self-pay | Admitting: Family Medicine

## 2020-10-23 MED FILL — VENLAFAXINE HCL ER 150 MG C: 150 | 30 days supply | Qty: 30 | Fill #1

## 2020-11-22 MED FILL — VENLAFAXINE HCL ER 150 MG C: 150 | 30 days supply | Qty: 30 | Fill #2

## 2020-12-21 ENCOUNTER — Other Ambulatory Visit: Payer: Self-pay | Admitting: Family Medicine

## 2020-12-21 ENCOUNTER — Other Ambulatory Visit (HOSPITAL_BASED_OUTPATIENT_CLINIC_OR_DEPARTMENT_OTHER): Payer: Self-pay

## 2020-12-21 MED ORDER — VENLAFAXINE HCL ER 150 MG PO CP24
150.0000 mg | ORAL_CAPSULE | Freq: Every day | ORAL | 0 refills | Status: DC
  Filled 2020-12-21: qty 30, 30d supply, fill #0

## 2021-01-12 ENCOUNTER — Other Ambulatory Visit (HOSPITAL_BASED_OUTPATIENT_CLINIC_OR_DEPARTMENT_OTHER): Payer: Self-pay

## 2021-01-12 MED ORDER — NORETHIN ACE-ETH ESTRAD-FE 1-20 MG-MCG(24) PO TABS
1.0000 | ORAL_TABLET | Freq: Every day | ORAL | 3 refills | Status: DC
  Filled 2021-01-12: qty 84, 84d supply, fill #0
  Filled 2021-04-02: qty 84, 84d supply, fill #1
  Filled 2021-07-02 – 2021-08-01 (×2): qty 84, 84d supply, fill #2
  Filled 2021-10-23: qty 84, 84d supply, fill #3

## 2021-01-17 ENCOUNTER — Other Ambulatory Visit (HOSPITAL_BASED_OUTPATIENT_CLINIC_OR_DEPARTMENT_OTHER): Payer: Self-pay

## 2021-01-17 ENCOUNTER — Other Ambulatory Visit: Payer: Self-pay | Admitting: Family Medicine

## 2021-01-17 MED ORDER — VENLAFAXINE HCL ER 150 MG PO CP24
150.0000 mg | ORAL_CAPSULE | Freq: Every day | ORAL | 0 refills | Status: DC
  Filled 2021-01-17: qty 30, 30d supply, fill #0

## 2021-01-30 ENCOUNTER — Other Ambulatory Visit: Payer: Self-pay

## 2021-01-30 ENCOUNTER — Other Ambulatory Visit (HOSPITAL_BASED_OUTPATIENT_CLINIC_OR_DEPARTMENT_OTHER): Payer: Self-pay

## 2021-01-30 ENCOUNTER — Ambulatory Visit (INDEPENDENT_AMBULATORY_CARE_PROVIDER_SITE_OTHER): Payer: No Typology Code available for payment source | Admitting: Family Medicine

## 2021-01-30 ENCOUNTER — Encounter: Payer: Self-pay | Admitting: Family Medicine

## 2021-01-30 VITALS — BP 116/84 | HR 83 | Temp 98.2°F | Resp 16 | Ht 66.0 in | Wt 291.2 lb

## 2021-01-30 DIAGNOSIS — F411 Generalized anxiety disorder: Secondary | ICD-10-CM | POA: Diagnosis not present

## 2021-01-30 DIAGNOSIS — L709 Acne, unspecified: Secondary | ICD-10-CM | POA: Insufficient documentation

## 2021-01-30 DIAGNOSIS — Z0000 Encounter for general adult medical examination without abnormal findings: Secondary | ICD-10-CM | POA: Diagnosis not present

## 2021-01-30 LAB — COMPREHENSIVE METABOLIC PANEL
ALT: 17 U/L (ref 0–35)
AST: 14 U/L (ref 0–37)
Albumin: 4.2 g/dL (ref 3.5–5.2)
Alkaline Phosphatase: 67 U/L (ref 39–117)
BUN: 11 mg/dL (ref 6–23)
CO2: 28 mEq/L (ref 19–32)
Calcium: 9 mg/dL (ref 8.4–10.5)
Chloride: 104 mEq/L (ref 96–112)
Creatinine, Ser: 0.76 mg/dL (ref 0.40–1.20)
GFR: 106.67 mL/min (ref >60.00)
Glucose, Bld: 92 mg/dL (ref 70–99)
Potassium: 4 mEq/L (ref 3.5–5.1)
Sodium: 140 mEq/L (ref 135–145)
Total Bilirubin: 0.3 mg/dL (ref 0.2–1.2)
Total Protein: 6.7 g/dL (ref 6.0–8.3)

## 2021-01-30 LAB — CBC
HCT: 36.8 % (ref 36.0–46.0)
Hemoglobin: 12.2 g/dL (ref 12.0–15.0)
MCHC: 33.3 g/dL (ref 30.0–36.0)
MCV: 83.2 fl (ref 78.0–100.0)
Platelets: 261 10*3/uL (ref 150.0–400.0)
RBC: 4.42 Mil/uL (ref 3.87–5.11)
RDW: 13.9 % (ref 11.5–15.5)
WBC: 5.8 10*3/uL (ref 4.0–10.5)

## 2021-01-30 LAB — LIPID PANEL
Cholesterol: 174 mg/dL (ref 0–200)
HDL: 73.7 mg/dL (ref >39.00)
LDL Cholesterol: 81 mg/dL (ref 0–99)
NonHDL: 100.68
Total CHOL/HDL Ratio: 2
Triglycerides: 100 mg/dL (ref 0.0–149.0)
VLDL: 20 mg/dL (ref 0.0–40.0)

## 2021-01-30 MED ORDER — VENLAFAXINE HCL ER 75 MG PO CP24
75.0000 mg | ORAL_CAPSULE | Freq: Every day | ORAL | 5 refills | Status: DC
  Filled 2021-01-30: qty 30, 30d supply, fill #0
  Filled 2021-02-27: qty 30, 30d supply, fill #1

## 2021-01-30 MED ORDER — CLINDAMYCIN PHOSPHATE 1 % EX SOLN
Freq: Two times a day (BID) | CUTANEOUS | 0 refills | Status: DC
  Filled 2021-01-30: qty 30, 30d supply, fill #0
  Filled 2021-01-30: qty 60, 30d supply, fill #0

## 2021-01-30 MED ORDER — VENLAFAXINE HCL ER 150 MG PO CP24
150.0000 mg | ORAL_CAPSULE | Freq: Every day | ORAL | 5 refills | Status: DC
  Filled 2021-01-30 – 2021-02-12 (×3): qty 30, 30d supply, fill #0

## 2021-01-30 NOTE — Patient Instructions (Addendum)
Give us 2-3 business days to get the results of your labs back.   Keep the diet clean and stay active.  Aim to do some physical exertion for 150 minutes per week. This is typically divided into 5 days per week, 30 minutes per day. The activity should be enough to get your heart rate up. Anything is better than nothing if you have time constraints.  Let us know if you need anything.  

## 2021-01-30 NOTE — Progress Notes (Signed)
Chief Complaint  Patient presents with  . Annual Exam     Well Woman Diane Rivera is here for a complete physical.   Her last physical was >1 year ago.  Current diet: in general, diet is fair. Current exercise: walking. Contraception? Yes Fatigue out of ordinary? No Seatbelt? Yes  Health Maintenance Pap/HPV- Yes Tetanus- Yes HIV screening- Yes STI screening- Yes   Anxiety The patient has a history of anxiety for which she takes Effexor XR 150 mg daily.  It was working well until she started to go through a divorce.  This happened in February 2022.  Her anxiety is gotten worse with fear of the unknown.  She is following with a Veterinary surgeon.  No homicidal or suicidal ideation.  No self-medication.  Patient has a history of acne for several months.  She feels it has gotten worse over the past several weeks as well.  She uses benzyl peroxide washes with a little relief.  She feels like masking makes it worse.  Past Medical History:  Diagnosis Date  . Depression    MODERATE PP DEPRESSIONS meds for 6 mos  . Hypertension   . Migraines      Past Surgical History:  Procedure Laterality Date  . ANKLE SURGERY      Medications  Current Outpatient Medications on File Prior to Visit  Medication Sig Dispense Refill  . Norethindrone Acetate-Ethinyl Estrad-FE (LOESTRIN 24 FE) 1-20 MG-MCG(24) tablet Take 1 tablet by mouth daily. 84 tablet 3    Allergies No Known Allergies  Review of Systems: Constitutional:  no unexpected weight changes Eye:  no recent significant change in vision Ear/Nose/Mouth/Throat:  Ears:  no tinnitus or vertigo and no recent change in hearing Nose/Mouth/Throat:  no complaints of nasal congestion, no sore throat Cardiovascular: no chest pain Respiratory:  no cough and no shortness of breath Gastrointestinal:  no abdominal pain, + constipation GU:  Female: negative for dysuria or pelvic pain Musculoskeletal/Extremities:  no pain of the  joints Integumentary (Skin/Breast): + Acne Neurologic:  no headaches Endocrine:  denies fatigue Hematologic/Lymphatic:  No areas of easy bleeding  Exam BP 116/84 (BP Location: Left Arm, Patient Position: Sitting, Cuff Size: Normal)   Pulse 83   Temp 98.2 F (36.8 C) (Oral)   Resp 16   Ht 5\' 6"  (1.676 m)   Wt 291 lb 4 oz (132.1 kg)   LMP 01/04/2021 (Exact Date)   SpO2 97%   Breastfeeding No   BMI 47.01 kg/m  General:  well developed, well nourished, in no apparent distress Skin: Close comedones noted over the face below her eyes; otherwise no significant moles, warts, or growths Head:  no masses, lesions, or tenderness Eyes:  pupils equal and round, sclera anicteric without injection Ears:  canals without lesions, TMs shiny without retraction, no obvious effusion, no erythema Nose:  nares patent, septum midline, mucosa normal, and no drainage or sinus tenderness Throat/Pharynx:  lips and gingiva without lesion; tongue and uvula midline; non-inflamed pharynx; no exudates or postnasal drainage Neck: neck supple without adenopathy, thyromegaly, or masses Lungs:  clear to auscultation, breath sounds equal bilaterally, no respiratory distress Cardio:  regular rate and rhythm, no bruits, no LE edema Abdomen:  abdomen soft, nontender; bowel sounds normal; no masses or organomegaly Genital: Defer to GYN Musculoskeletal:  symmetrical muscle groups noted without atrophy or deformity Extremities:  no clubbing, cyanosis, or edema, no deformities, no skin discoloration Neuro:  gait normal; deep tendon reflexes normal and symmetric Psych: well oriented with  normal range of affect and appropriate judgment/insight  Assessment and Plan  Well adult exam - Plan: CBC, Comprehensive metabolic panel, Lipid panel  Acne, unspecified acne type - Plan: clindamycin (CLEOCIN T) 1 % external solution  GAD (generalized anxiety disorder) - Plan: venlafaxine XR (EFFEXOR XR) 75 MG 24 hr capsule, venlafaxine  XR (EFFEXOR-XR) 150 MG 24 hr capsule  Morbid obesity (HCC)   Well 30 y.o. female. Counseled on diet and exercise. Other orders as above. Acne-continue benzyl peroxide washes, start Clindagel twice daily. Follow up in 1 month. GAD-stemming from divorce.  Continue with the counseling team.  Increase Effexor from 150 mg daily to 225 mg daily.  The patient voiced understanding and agreement to the plan.  Jilda Roche [place], DO 01/30/21 9:35 AM

## 2021-02-13 ENCOUNTER — Other Ambulatory Visit (HOSPITAL_BASED_OUTPATIENT_CLINIC_OR_DEPARTMENT_OTHER): Payer: Self-pay

## 2021-02-16 ENCOUNTER — Other Ambulatory Visit (HOSPITAL_BASED_OUTPATIENT_CLINIC_OR_DEPARTMENT_OTHER): Payer: Self-pay

## 2021-02-27 ENCOUNTER — Other Ambulatory Visit (HOSPITAL_BASED_OUTPATIENT_CLINIC_OR_DEPARTMENT_OTHER): Payer: Self-pay

## 2021-03-02 ENCOUNTER — Ambulatory Visit (INDEPENDENT_AMBULATORY_CARE_PROVIDER_SITE_OTHER): Payer: No Typology Code available for payment source | Admitting: Family Medicine

## 2021-03-02 ENCOUNTER — Encounter: Payer: Self-pay | Admitting: Family Medicine

## 2021-03-02 ENCOUNTER — Other Ambulatory Visit (HOSPITAL_BASED_OUTPATIENT_CLINIC_OR_DEPARTMENT_OTHER): Payer: Self-pay

## 2021-03-02 ENCOUNTER — Other Ambulatory Visit: Payer: Self-pay

## 2021-03-02 VITALS — BP 110/64 | HR 118 | Temp 99.4°F | Ht 66.0 in | Wt 288.0 lb

## 2021-03-02 DIAGNOSIS — L709 Acne, unspecified: Secondary | ICD-10-CM | POA: Diagnosis not present

## 2021-03-02 DIAGNOSIS — F411 Generalized anxiety disorder: Secondary | ICD-10-CM | POA: Diagnosis not present

## 2021-03-02 MED ORDER — VENLAFAXINE HCL ER 75 MG PO CP24
75.0000 mg | ORAL_CAPSULE | Freq: Every day | ORAL | 2 refills | Status: DC
  Filled 2021-03-02: qty 90, 90d supply, fill #0
  Filled 2021-05-30: qty 90, 90d supply, fill #1
  Filled 2021-05-31: qty 90, 90d supply, fill #0
  Filled 2021-08-29 – 2021-08-30 (×2): qty 90, 90d supply, fill #1

## 2021-03-02 MED ORDER — MINOCYCLINE HCL 100 MG PO CAPS
100.0000 mg | ORAL_CAPSULE | Freq: Two times a day (BID) | ORAL | 2 refills | Status: DC
  Filled 2021-03-02: qty 30, 15d supply, fill #0

## 2021-03-02 MED ORDER — VENLAFAXINE HCL ER 150 MG PO CP24
150.0000 mg | ORAL_CAPSULE | Freq: Every day | ORAL | 2 refills | Status: DC
  Filled 2021-03-02 – 2021-03-20 (×2): qty 90, 90d supply, fill #0
  Filled 2021-06-21: qty 90, 90d supply, fill #1
  Filled 2021-06-22 – 2021-10-03 (×2): qty 90, 90d supply, fill #0
  Filled 2021-10-03: qty 90, 90d supply, fill #1

## 2021-03-02 NOTE — Patient Instructions (Signed)
If you do not hear anything about your referral in the next 1-2 weeks, call our office and ask for an update.  OK to cancel the appointment if the antibiotic is helpful.   Let us know if you need anything.

## 2021-03-02 NOTE — Progress Notes (Signed)
Chief Complaint  Patient presents with   Follow-up    Subjective: Patient is a 30 y.o. female here for f/u.  Patient is going through a separation which has been quite stressful.  It has increased her anxiety.  1 month ago we increased her venlafaxine dosage from 150 mg daily to 225 mg daily.  She is compliant with the medication and reports no new adverse effects.  She reports good improvement and does not wish to make any changes at this time.  She was started on Cleocin for acne.  She notices very mild improvement at best.  She does not see a dermatologist and has never been on an oral medication before.  Past Medical History:  Diagnosis Date   Depression    MODERATE PP DEPRESSIONS meds for 6 mos   Hypertension    Migraines     Objective: BP 110/64   Pulse (!) 118   Temp 99.4 F (37.4 C) (Oral)   Ht 5\' 6"  (1.676 m)   Wt 288 lb (130.6 kg)   SpO2 99%   BMI 46.48 kg/m  General: Awake, appears stated age Skin: Open and close comedones noted on her face Lungs: No accessory muscle use Psych: Age appropriate judgment and insight, normal affect and mood  Assessment and Plan: GAD (generalized anxiety disorder) - Plan: venlafaxine XR (EFFEXOR XR) 75 MG 24 hr capsule, venlafaxine XR (EFFEXOR-XR) 150 MG 24 hr capsule  Acne, unspecified acne type - Plan: minocycline (MINOCIN) 100 MG capsule, Ambulatory referral to Dermatology  1.  Chronic, stable.  Continue Effexor 225 mg daily. 2.  Chronic, uncontrolled.  Stop Cleocin.  Start minocycline daily.  Refer to dermatology. I will see her in 6 months for medication check or as needed. The patient voiced understanding and agreement to the plan.  [place], DO 03/02/21  9:26 AM

## 2021-03-20 ENCOUNTER — Telehealth: Payer: Self-pay | Admitting: Physician Assistant

## 2021-03-20 ENCOUNTER — Other Ambulatory Visit (HOSPITAL_BASED_OUTPATIENT_CLINIC_OR_DEPARTMENT_OTHER): Payer: Self-pay

## 2021-03-20 NOTE — Telephone Encounter (Signed)
Patient is calling for a referral appointment from [facility], DO.  Patient is scheduled for 09/20/2021 at 2:00 with [facility], PA-C.

## 2021-03-21 NOTE — Telephone Encounter (Signed)
Referral attached to appointment

## 2021-04-02 ENCOUNTER — Other Ambulatory Visit (HOSPITAL_BASED_OUTPATIENT_CLINIC_OR_DEPARTMENT_OTHER): Payer: Self-pay

## 2021-04-02 ENCOUNTER — Other Ambulatory Visit (HOSPITAL_COMMUNITY): Payer: Self-pay

## 2021-05-30 ENCOUNTER — Other Ambulatory Visit (HOSPITAL_BASED_OUTPATIENT_CLINIC_OR_DEPARTMENT_OTHER): Payer: Self-pay

## 2021-05-31 ENCOUNTER — Other Ambulatory Visit (HOSPITAL_BASED_OUTPATIENT_CLINIC_OR_DEPARTMENT_OTHER): Payer: Self-pay

## 2021-05-31 ENCOUNTER — Other Ambulatory Visit (HOSPITAL_COMMUNITY): Payer: Self-pay

## 2021-06-21 ENCOUNTER — Other Ambulatory Visit (HOSPITAL_BASED_OUTPATIENT_CLINIC_OR_DEPARTMENT_OTHER): Payer: Self-pay

## 2021-06-22 ENCOUNTER — Other Ambulatory Visit (HOSPITAL_COMMUNITY): Payer: Self-pay

## 2021-06-22 ENCOUNTER — Other Ambulatory Visit (HOSPITAL_BASED_OUTPATIENT_CLINIC_OR_DEPARTMENT_OTHER): Payer: Self-pay

## 2021-07-02 ENCOUNTER — Other Ambulatory Visit (HOSPITAL_COMMUNITY): Payer: Self-pay

## 2021-07-10 ENCOUNTER — Other Ambulatory Visit (HOSPITAL_COMMUNITY): Payer: Self-pay

## 2021-08-01 ENCOUNTER — Other Ambulatory Visit (HOSPITAL_BASED_OUTPATIENT_CLINIC_OR_DEPARTMENT_OTHER): Payer: Self-pay

## 2021-08-29 ENCOUNTER — Other Ambulatory Visit (HOSPITAL_COMMUNITY): Payer: Self-pay

## 2021-08-30 ENCOUNTER — Other Ambulatory Visit (HOSPITAL_COMMUNITY): Payer: Self-pay

## 2021-08-30 ENCOUNTER — Other Ambulatory Visit (HOSPITAL_BASED_OUTPATIENT_CLINIC_OR_DEPARTMENT_OTHER): Payer: Self-pay

## 2021-09-04 ENCOUNTER — Ambulatory Visit: Payer: No Typology Code available for payment source | Admitting: Family Medicine

## 2021-09-20 ENCOUNTER — Ambulatory Visit: Payer: No Typology Code available for payment source | Admitting: Physician Assistant

## 2021-10-03 ENCOUNTER — Ambulatory Visit (INDEPENDENT_AMBULATORY_CARE_PROVIDER_SITE_OTHER): Payer: No Typology Code available for payment source | Admitting: Family Medicine

## 2021-10-03 ENCOUNTER — Other Ambulatory Visit (HOSPITAL_BASED_OUTPATIENT_CLINIC_OR_DEPARTMENT_OTHER): Payer: Self-pay

## 2021-10-03 ENCOUNTER — Encounter: Payer: Self-pay | Admitting: Family Medicine

## 2021-10-03 ENCOUNTER — Other Ambulatory Visit (HOSPITAL_COMMUNITY): Payer: Self-pay

## 2021-10-03 ENCOUNTER — Telehealth: Payer: Self-pay

## 2021-10-03 VITALS — BP 120/70 | HR 84 | Temp 99.1°F | Ht 66.0 in | Wt 265.5 lb

## 2021-10-03 DIAGNOSIS — F411 Generalized anxiety disorder: Secondary | ICD-10-CM

## 2021-10-03 MED ORDER — SEMAGLUTIDE-WEIGHT MANAGEMENT 1 MG/0.5ML ~~LOC~~ SOAJ
1.0000 mg | SUBCUTANEOUS | 0 refills | Status: DC
  Filled 2021-10-03 – 2021-12-07 (×2): qty 2, 28d supply, fill #0

## 2021-10-03 MED ORDER — SEMAGLUTIDE-WEIGHT MANAGEMENT 0.25 MG/0.5ML ~~LOC~~ SOAJ
0.2500 mg | SUBCUTANEOUS | 0 refills | Status: AC
  Filled 2021-10-03 – 2021-10-05 (×2): qty 2, 28d supply, fill #0

## 2021-10-03 MED ORDER — SEMAGLUTIDE-WEIGHT MANAGEMENT 2.4 MG/0.75ML ~~LOC~~ SOAJ
2.4000 mg | SUBCUTANEOUS | 0 refills | Status: DC
  Filled 2021-10-03 – 2022-04-02 (×4): qty 3, 28d supply, fill #0

## 2021-10-03 MED ORDER — SEMAGLUTIDE-WEIGHT MANAGEMENT 1.7 MG/0.75ML ~~LOC~~ SOAJ
1.7000 mg | SUBCUTANEOUS | 0 refills | Status: DC
  Filled 2021-10-03 – 2022-01-01 (×2): qty 3, 28d supply, fill #0

## 2021-10-03 MED ORDER — SEMAGLUTIDE-WEIGHT MANAGEMENT 0.5 MG/0.5ML ~~LOC~~ SOAJ
0.5000 mg | SUBCUTANEOUS | 0 refills | Status: AC
  Filled 2021-10-03 – 2021-11-02 (×2): qty 2, 28d supply, fill #0

## 2021-10-03 NOTE — Progress Notes (Signed)
Chief Complaint  Patient presents with   Follow-up    Medication Discuss Weight loss injection    Subjective Diane Rivera presents for f/u anxiety/depression.  Pt is currently being treated with Effexor 225 mg/d. Marland Kitchen  Reports doing well since treatment. No thoughts of harming self or others. No self-medication with alcohol, prescription drugs or illicit drugs. Pt is not following with a counselor/psychologist.  Morbid obesity Patient has struggled with weight for several years.  She is currently in the process of losing weight and has lost nearly 25 pounds over the past 7 months.  She does not get much exercise and but is cutting down on portions.  She feels much better since losing weight.  She is interested in a weekly injection to help with the process.  She has never been on anything in the past.  Past Medical History:  Diagnosis Date   Depression    MODERATE PP DEPRESSIONS meds for 6 mos   Hypertension    Migraines    Allergies as of 10/03/2021   No Known Allergies      Medication List        Accurate as of October 03, 2021 11:28 AM. If you have any questions, ask your nurse or doctor.          STOP taking these medications    minocycline 100 MG capsule Commonly known as: Minocin Stopped by: Sharlene Dory, DO       TAKE these medications    Blisovi 24 Fe 1-20 MG-MCG(24) tablet Generic drug: Norethindrone Acetate-Ethinyl Estrad-FE Take 1 tablet by mouth daily.   Semaglutide-Weight Management 0.25 MG/0.5ML Soaj Inject 0.25 mg into the skin once a week for 28 days. Started by: Sharlene Dory, DO   Semaglutide-Weight Management 0.5 MG/0.5ML Soaj Inject 0.5 mg into the skin once a week for 28 days. Start taking on: November 01, 2021 Started by: Sharlene Dory, DO   Semaglutide-Weight Management 1 MG/0.5ML Soaj Inject 1 mg into the skin once a week for 28 days. Start taking on: November 30, 2021 Started by: Sharlene Dory, DO   Semaglutide-Weight Management 1.7 MG/0.75ML Soaj Inject 1.7 mg into the skin once a week for 28 days. Start taking on: December 29, 2021 Started by: Sharlene Dory, DO   Semaglutide-Weight Management 2.4 MG/0.75ML Soaj Inject 2.4 mg into the skin once a week for 28 days. Start taking on: Jan 27, 2022 Started by: Sharlene Dory, DO   venlafaxine XR 75 MG 24 hr capsule Commonly known as: Effexor XR Take 1 capsule (75 mg total) by mouth daily with breakfast with the 150 mg capsule for a total of 225 mg daily.   venlafaxine XR 150 MG 24 hr capsule Commonly known as: EFFEXOR-XR Take 1 capsule (150 mg total) by mouth daily with breakfast. Take with the 75 mg capsule for a total of 225 mg daily.        Exam BP 120/70    Pulse 84    Temp 99.1 F (37.3 C) (Oral)    Ht 5\' 6"  (1.676 m)    Wt 265 lb 8 oz (120.4 kg)    SpO2 99%    BMI 42.85 kg/m  General:  well developed, well nourished, in no apparent distress Heart: RRR Lungs:  CTAB. No respiratory distress Psych: well oriented with normal range of affect and age-appropriate judgement/insight, alert and oriented x4.  Assessment and Plan  GAD (generalized anxiety disorder)  Morbid obesity (HCC), Chronic -  Plan: Semaglutide-Weight Management 0.25 MG/0.5ML SOAJ, Semaglutide-Weight Management 0.5 MG/0.5ML SOAJ, Semaglutide-Weight Management 1 MG/0.5ML SOAJ, Semaglutide-Weight Management 1.7 MG/0.75ML SOAJ, Semaglutide-Weight Management 2.4 MG/0.75ML SOAJ  Chronic, stable.  Continue Effexor 225 mg daily. Chronic, not currently stable.  Commended on current weight loss.  We will start [facility] gradually increase dosage.  Counseled on diet and exercise.  Could consider semaglutide/Ozempic if supplies/cost issues. F/u in 6 mo for CPE or prn. The patient voiced understanding and agreement to the plan.  Jilda Roche [place], DO 10/03/21 11:28 AM

## 2021-10-03 NOTE — Telephone Encounter (Signed)
PA initiated via Covermymeds; KEY:  [id number]. Awaiting determination.

## 2021-10-03 NOTE — Patient Instructions (Addendum)
Let me know if there are cost issues or supply issues.   Really really strong work with your weight loss.   Consider some weight resistance exercise.   Let us know if you need anything.

## 2021-10-04 ENCOUNTER — Other Ambulatory Visit (HOSPITAL_BASED_OUTPATIENT_CLINIC_OR_DEPARTMENT_OTHER): Payer: Self-pay

## 2021-10-05 ENCOUNTER — Other Ambulatory Visit (HOSPITAL_BASED_OUTPATIENT_CLINIC_OR_DEPARTMENT_OTHER): Payer: Self-pay

## 2021-10-05 ENCOUNTER — Other Ambulatory Visit (HOSPITAL_COMMUNITY): Payer: Self-pay

## 2021-10-05 NOTE — Telephone Encounter (Signed)
PA approved.   The request has been approved. The authorization is effective for a maximum of 3 fills from 10/04/2021 to 01/01/2022, as long as the member is enrolled in their current health plan. The request was approved as submitted. This request is approved for 15mL per 28 days.Additional prior authorizations (PA) have been entered [id number] 0.5mg /0.24mL for a maximum of 3 fills with a quantity limit of 4 pens (29mL) per 28 days (PA 5511)Wegovy 1mg /0.1mL for a maximum of 3 fills with a quantity limit of 4 pens (5mL) per 28 days (PA 5512)Wegovy 1.7mg /0.83mL for a maximum of 3 fills with a quantity limit of 4 pens (35mL) per 28 days (PA 5513)Wegovy 2.4mg /0.38mL for a maximum of 3 fills with a quantity limit of 4 pens (70mL) per 28 days (PA 5514)effective 10/04/2021 through 01/01/2022. A written notification letter will follow with additional details.

## 2021-10-23 ENCOUNTER — Other Ambulatory Visit (HOSPITAL_COMMUNITY): Payer: Self-pay

## 2021-11-02 ENCOUNTER — Other Ambulatory Visit (HOSPITAL_BASED_OUTPATIENT_CLINIC_OR_DEPARTMENT_OTHER): Payer: Self-pay

## 2021-11-12 ENCOUNTER — Other Ambulatory Visit: Payer: Self-pay | Admitting: Family Medicine

## 2021-11-12 ENCOUNTER — Other Ambulatory Visit (HOSPITAL_BASED_OUTPATIENT_CLINIC_OR_DEPARTMENT_OTHER): Payer: Self-pay

## 2021-11-12 DIAGNOSIS — F411 Generalized anxiety disorder: Secondary | ICD-10-CM

## 2021-11-12 MED ORDER — VENLAFAXINE HCL ER 75 MG PO CP24
75.0000 mg | ORAL_CAPSULE | Freq: Every day | ORAL | 2 refills | Status: DC
  Filled 2021-11-12 – 2021-12-07 (×2): qty 90, 90d supply, fill #0

## 2021-11-22 ENCOUNTER — Other Ambulatory Visit (HOSPITAL_BASED_OUTPATIENT_CLINIC_OR_DEPARTMENT_OTHER): Payer: Self-pay

## 2021-11-26 ENCOUNTER — Other Ambulatory Visit (HOSPITAL_BASED_OUTPATIENT_CLINIC_OR_DEPARTMENT_OTHER): Payer: Self-pay

## 2021-12-07 ENCOUNTER — Other Ambulatory Visit (HOSPITAL_COMMUNITY): Payer: Self-pay

## 2021-12-25 ENCOUNTER — Ambulatory Visit (INDEPENDENT_AMBULATORY_CARE_PROVIDER_SITE_OTHER): Payer: No Typology Code available for payment source | Admitting: Dermatology

## 2021-12-25 ENCOUNTER — Other Ambulatory Visit (HOSPITAL_BASED_OUTPATIENT_CLINIC_OR_DEPARTMENT_OTHER): Payer: Self-pay

## 2021-12-25 ENCOUNTER — Encounter: Payer: Self-pay | Admitting: Dermatology

## 2021-12-25 DIAGNOSIS — L700 Acne vulgaris: Secondary | ICD-10-CM

## 2021-12-25 MED ORDER — TRETINOIN 0.025 % EX CREA
TOPICAL_CREAM | Freq: Every day | CUTANEOUS | 1 refills | Status: DC
  Filled 2021-12-25 – 2021-12-31 (×3): qty 45, 30d supply, fill #0

## 2021-12-25 MED ORDER — MINOCYCLINE HCL 50 MG PO CAPS
50.0000 mg | ORAL_CAPSULE | Freq: Two times a day (BID) | ORAL | 1 refills | Status: DC
  Filled 2021-12-25 – 2021-12-31 (×2): qty 30, 15d supply, fill #0

## 2021-12-26 ENCOUNTER — Other Ambulatory Visit (HOSPITAL_BASED_OUTPATIENT_CLINIC_OR_DEPARTMENT_OTHER): Payer: Self-pay

## 2021-12-26 ENCOUNTER — Other Ambulatory Visit (HOSPITAL_COMMUNITY): Payer: Self-pay

## 2021-12-27 ENCOUNTER — Telehealth: Payer: Self-pay | Admitting: *Deleted

## 2021-12-27 ENCOUNTER — Other Ambulatory Visit (HOSPITAL_BASED_OUTPATIENT_CLINIC_OR_DEPARTMENT_OTHER): Payer: Self-pay

## 2021-12-27 NOTE — Telephone Encounter (Signed)
Received prior authorization for tretinoin cream- done via cover my meds.  ? ?Diane Rivera (Key: [id number]) ? ?Your information has been sent to [employer]. ? ?Diane Rivera (Key: [id number]) ? ?[employer] is reviewing your PA request. You may close this dialog, return to your dashboard, and perform other tasks. ? ?To check for an update later, open this request again from your dashboard. If [employer] has not replied within 24 hours for urgent requests or within 48 hours for standard requests, please contact [employer] at [phone]. ?

## 2021-12-27 NOTE — Telephone Encounter (Signed)
Patients tretinoin was approved 12/27/21-12/16/2022. Reference number is [id number] ?

## 2021-12-28 ENCOUNTER — Other Ambulatory Visit (HOSPITAL_BASED_OUTPATIENT_CLINIC_OR_DEPARTMENT_OTHER): Payer: Self-pay

## 2021-12-31 ENCOUNTER — Other Ambulatory Visit (HOSPITAL_COMMUNITY): Payer: Self-pay

## 2021-12-31 ENCOUNTER — Other Ambulatory Visit (HOSPITAL_BASED_OUTPATIENT_CLINIC_OR_DEPARTMENT_OTHER): Payer: Self-pay

## 2021-12-31 ENCOUNTER — Other Ambulatory Visit: Payer: Self-pay | Admitting: Family Medicine

## 2022-01-01 ENCOUNTER — Other Ambulatory Visit: Payer: Self-pay | Admitting: Family Medicine

## 2022-01-01 ENCOUNTER — Other Ambulatory Visit: Payer: Self-pay

## 2022-01-01 ENCOUNTER — Other Ambulatory Visit (HOSPITAL_COMMUNITY): Payer: Self-pay

## 2022-01-01 ENCOUNTER — Other Ambulatory Visit (HOSPITAL_BASED_OUTPATIENT_CLINIC_OR_DEPARTMENT_OTHER): Payer: Self-pay

## 2022-01-01 DIAGNOSIS — F411 Generalized anxiety disorder: Secondary | ICD-10-CM

## 2022-01-01 MED ORDER — NORETHIN ACE-ETH ESTRAD-FE 1-20 MG-MCG(24) PO TABS
1.0000 | ORAL_TABLET | Freq: Every day | ORAL | 3 refills | Status: DC
Start: 2022-01-01 — End: 2022-02-21
  Filled 2022-01-01: qty 84, 84d supply, fill #0
  Filled 2022-01-01: qty 84, 72d supply, fill #0

## 2022-01-01 MED ORDER — NORETHIN ACE-ETH ESTRAD-FE 1-20 MG-MCG(24) PO TABS
1.0000 | ORAL_TABLET | Freq: Every day | ORAL | 0 refills | Status: DC
  Filled 2022-01-01: qty 84, 84d supply, fill #0

## 2022-01-01 MED ORDER — VENLAFAXINE HCL ER 150 MG PO CP24
150.0000 mg | ORAL_CAPSULE | Freq: Every day | ORAL | 2 refills | Status: DC
  Filled 2022-01-01 (×2): qty 90, 90d supply, fill #0
  Filled 2022-04-12 – 2022-05-03 (×2): qty 90, 90d supply, fill #1
  Filled 2022-07-31: qty 90, 90d supply, fill #2

## 2022-01-02 ENCOUNTER — Other Ambulatory Visit (HOSPITAL_COMMUNITY): Payer: Self-pay

## 2022-01-12 ENCOUNTER — Encounter: Payer: Self-pay | Admitting: Dermatology

## 2022-01-12 NOTE — Progress Notes (Signed)
? ?  New Patient ?  ?Subjective  ?Diane Rivera is a 31 y.o. female who presents for the following: Acne (Pt states she has taken clindamycin and a topical medication.Marland Kitchen ). ? ?Acne, predominantly facial ?Location:  ?Duration:  ?Quality:  ?Associated Signs/Symptoms: ?Modifying Factors:  ?Severity:  ?Timing: ?Context:  ? ? ?The following portions of the chart were reviewed this encounter and updated as appropriate:  Tobacco  Allergies  Meds  Problems  Med Hx  Surg Hx  Fam Hx   ?  ? ?Objective  ?Well appearing patient in no apparent distress; mood and affect are within normal limits. ?Head - Anterior (Face) ?Detailed discussion about the proven path mechanism of acne along with essentially all treatment options from topicals to isotretinoin.  She has mixed inflammatory more than comedonal acne with some deep inflammatory papules.  It does have a negative impact on her quality of life. ? ? ? ?A focused examination was performed including head, neck, back. Relevant physical exam findings are noted in the Assessment and Plan. ? ? ?Assessment & Plan  ?Acne vulgaris ?Head - Anterior (Face) ? ?Initially will use a generic tretinoin plus oral minocycline 50 mg twice daily.  All side effects detailed.  If there is enough improvement in 6 to 8 weeks she may choose to cancel her follow-up visit. ? ?minocycline (MINOCIN) 50 MG capsule - Head - Anterior (Face) ?Take 1 capsule (50 mg total) by mouth 2 (two) times daily. ? ?tretinoin (RETIN-A) 0.025 % cream - Head - Anterior (Face) ?Apply on to the skin at bedtime. ? ? ?

## 2022-01-23 IMAGING — DX DG ANKLE COMPLETE 3+V*R*
3 series · 3 of 3 positions shown · non-contrast
Comparison: Right foot radiograph dated 07/01/2016.

CLINICAL DATA: [age] year female with acute right ankle
pain.

EXAM:
RIGHT ANKLE - COMPLETE 3+ VIEW

[ankle ap]
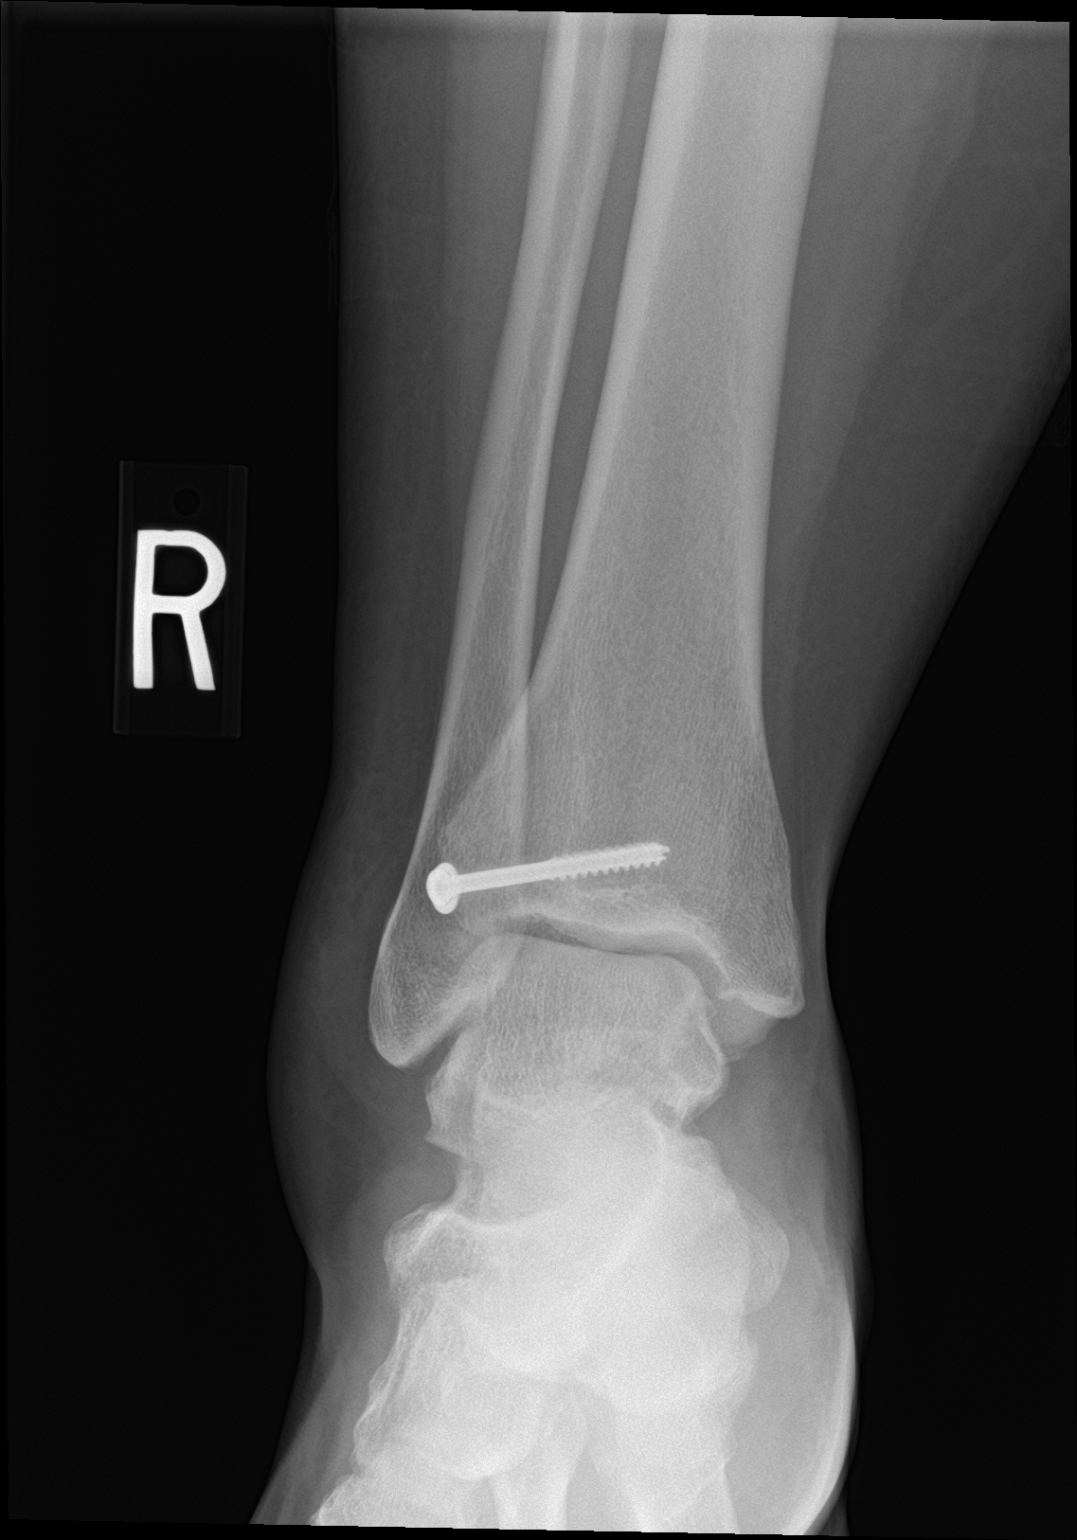

[ankle obl]
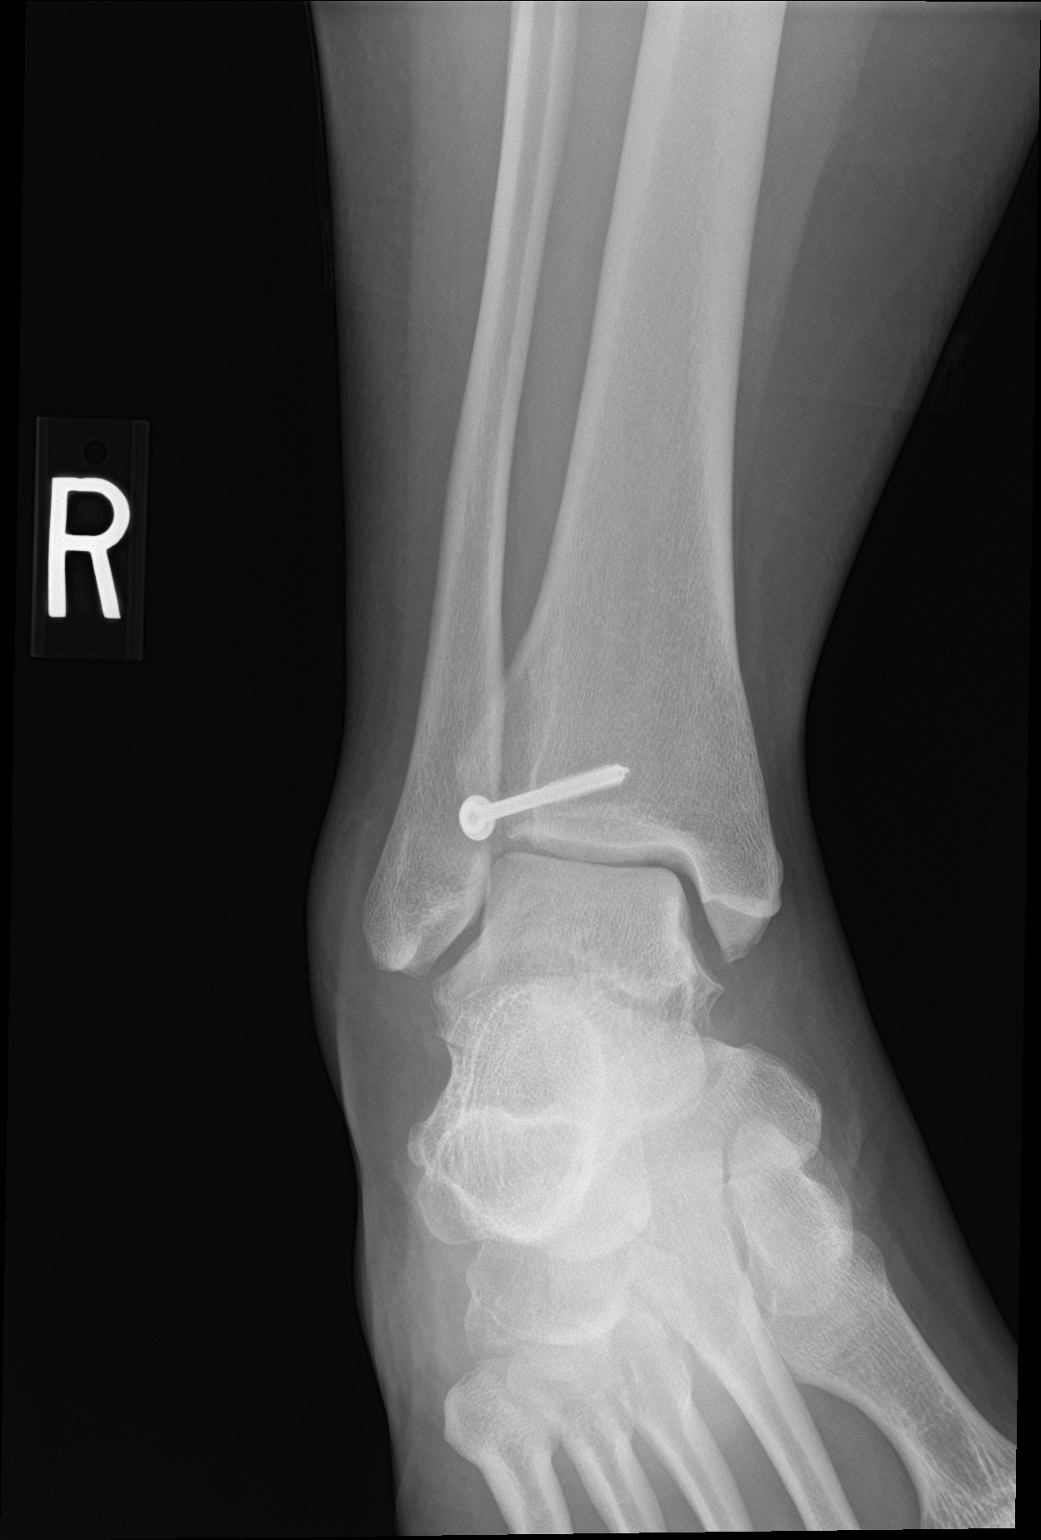

[ankle lat]
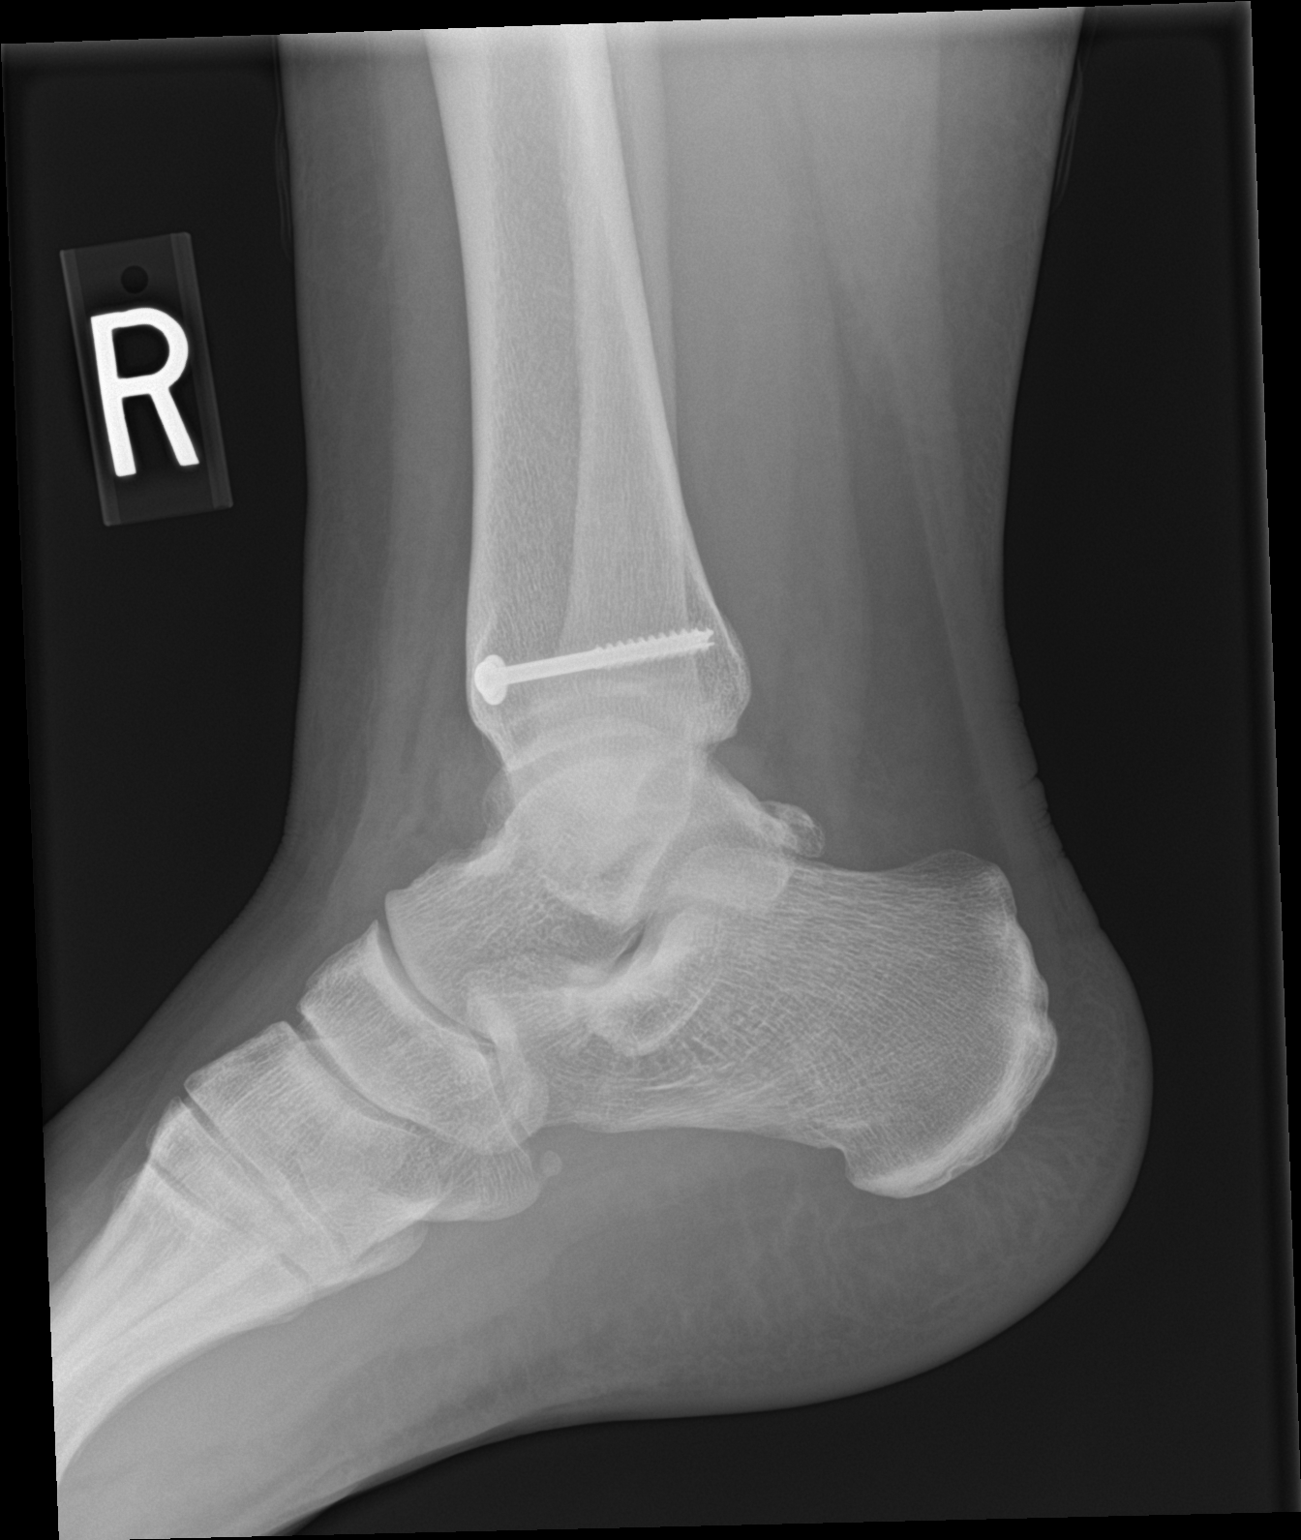

[3 of 3 positions shown; findings below may reference images not displayed]

FINDINGS: There is no acute fracture or dislocation. Prior distal tibial
fixation pin again noted which appears intact. No evidence of
loosening. The bones are well mineralized. No arthritic changes. The
ankle mortise is intact. There is mild soft tissue swelling over the
lateral malleolus.
IMPRESSION: No acute fracture or dislocation.

## 2022-01-25 ENCOUNTER — Ambulatory Visit (HOSPITAL_COMMUNITY)
Admission: EM | Admit: 2022-01-25 | Discharge: 2022-01-25 | Disposition: A | Discharge status: Home / Self Care | Payer: No Typology Code available for payment source | Attending: Family Medicine | Admitting: Family Medicine

## 2022-01-25 ENCOUNTER — Other Ambulatory Visit (HOSPITAL_BASED_OUTPATIENT_CLINIC_OR_DEPARTMENT_OTHER): Payer: Self-pay

## 2022-01-25 DIAGNOSIS — T7840XA Allergy, unspecified, initial encounter: Secondary | ICD-10-CM | POA: Diagnosis not present

## 2022-01-25 DIAGNOSIS — R220 Localized swelling, mass and lump, head: Secondary | ICD-10-CM

## 2022-01-25 DIAGNOSIS — R21 Rash and other nonspecific skin eruption: Secondary | ICD-10-CM | POA: Diagnosis not present

## 2022-01-25 MED ORDER — METHYLPREDNISOLONE SODIUM SUCC 125 MG IJ SOLR
INTRAMUSCULAR | Status: AC
  Filled 2022-01-25: qty 2

## 2022-01-25 MED ORDER — METHYLPREDNISOLONE SODIUM SUCC 125 MG IJ SOLR
125.0000 mg | Freq: Once | INTRAMUSCULAR | Status: AC
  Administered 2022-01-25: 125 mg via INTRAMUSCULAR

## 2022-01-25 MED ORDER — PREDNISONE 50 MG PO TABS
50.0000 mg | ORAL_TABLET | Freq: Every day | ORAL | 0 refills | Status: AC
  Filled 2022-01-25: qty 5, 5d supply, fill #0

## 2022-01-25 NOTE — ED Provider Notes (Signed)
?[facility] ? ? ? ?CSN: [id number] ?Arrival date & time: 01/25/22  0802 ? ? ?  ? ?History   ?Chief Complaint ?Chief Complaint  ?Patient presents with  ? Oral Swelling  ? ? ?HPI ?Diane Rivera is a 31 y.o. female.  ? ?Started yesterday morning with an itchy throat and cough.  Then was super itchy.  Started with rash, hives later in the day.  This morning her upper lip is swollen.  ?No new foods, soaps, detergent, medications.  ?She did take 2 benadryl last night.  ? ?Past Medical History:  ?Diagnosis Date  ? Depression   ? MODERATE PP DEPRESSIONS meds for 6 mos  ? Hypertension   ? Migraines   ? ? ?Patient Active Problem List  ? Diagnosis Date Noted  ? GAD (generalized anxiety disorder) 01/30/2021  ? Acne 01/30/2021  ? Sprain of ankle, right 10/28/2019  ? Anxiety and depression 04/27/2018  ? Morbid obesity (HCC) 04/27/2018  ? Pregnant 07/03/2017  ? ? ?Past Surgical History:  ?Procedure Laterality Date  ? ANKLE SURGERY    ? ? ?OB History   ? ? Gravida  ?2  ? Para  ?2  ? Term  ?2  ? Preterm  ?   ? AB  ?   ? Living  ?2  ?  ? ? SAB  ?   ? IAB  ?   ? Ectopic  ?   ? Multiple  ?0  ? Live Births  ?2  ?   ?  ?  ? ? ? ?Home Medications   ? ?Prior to Admission medications   ?Medication Sig Start Date End Date Taking? Authorizing Provider  ?minocycline (MINOCIN) 50 MG capsule Take 1 capsule (50 mg total) by mouth 2 (two) times daily. 12/25/21   Janalyn Harder, MD  ?Norethindrone Acetate-Ethinyl Estrad-FE (LOESTRIN 24 FE) 1-20 MG-MCG(24) tablet Take 1 tablet by mouth daily. Skip non-hormone tablets and start a new pack. 01/01/22   Levie Heritage, DO  ?Semaglutide-Weight Management 1.7 MG/0.75ML SOAJ Inject 1.7 mg into the skin once a week for 28 days. ?Patient not taking: Reported on 12/25/2021 12/29/21 01/31/22  Sharlene Dory, DO  ?Semaglutide-Weight Management 2.4 MG/0.75ML SOAJ Inject 2.4 mg into the skin once a week for 28 days. ?Patient not taking: Reported on 12/25/2021 01/27/22 02/24/22  Sharlene Dory, DO  ?tretinoin (RETIN-A) 0.025 % cream Apply on to the skin at bedtime. 12/25/21 12/25/22  Janalyn Harder, MD  ?venlafaxine XR (EFFEXOR XR) 75 MG 24 hr capsule Take 1 capsule (75 mg total) by mouth daily with breakfast with the 150 mg capsule for a total of 225 mg daily. 11/12/21   Sharlene Dory, DO  ?venlafaxine XR (EFFEXOR-XR) 150 MG 24 hr capsule Take 1 capsule (150 mg total) by mouth daily with breakfast. Take with the 75 mg capsule for a total of 225 mg daily. 01/01/22   Sharlene Dory, DO  ? ? ?Family History ?Family History  ?Problem Relation Age of Onset  ? Hypertension Father   ? Lung cancer Maternal Grandmother   ? Cancer Maternal Grandfather   ?     colon  ? ? ?Social History ?Social History  ? ?Tobacco Use  ? Smoking status: Former  ? Smokeless tobacco: Never  ?Substance Use Topics  ? Alcohol use: Yes  ? Drug use: No  ? ? ? ?Allergies   ?Patient has no known allergies. ? ? ?Review of Systems ?Review of Systems  ?Constitutional:  Negative for activity change, chills and fever.  ?HENT:  Negative for facial swelling, sore throat and trouble swallowing.   ?Eyes: Negative.   ?Respiratory: Negative.    ?Cardiovascular: Negative.   ?Gastrointestinal: Negative.   ?Genitourinary: Negative.   ?Skin:  Positive for rash.  ? ? ?Physical Exam ?Triage Vital Signs ?ED Triage Vitals  ?Enc Vitals Group  ?   BP 01/25/22 0817 131/88  ?   Pulse Rate 01/25/22 0817 85  ?   Resp 01/25/22 0817 16  ?   Temp 01/25/22 0817 98.2 ?F (36.8 ?C)  ?   Temp Source 01/25/22 0817 Oral  ?   SpO2 01/25/22 0817 93 %  ?   Weight --   ?   Height --   ?   Head Circumference --   ?   Peak Flow --   ?   Pain Score 01/25/22 0818 0  ?   Pain Loc --   ?   Pain Edu? --   ?   Excl. in GC? --   ? ?No data found. ? ?Updated Vital Signs ?BP 131/88 (BP Location: Left Arm)   Pulse 85   Temp 98.2 ?F (36.8 ?C) (Oral)   Resp 16   SpO2 93%  ? ?Visual Acuity ?Right Eye Distance:   ?Left Eye Distance:   ?Bilateral Distance:   ? ?Right Eye  Near:   ?Left Eye Near:    ?Bilateral Near:    ? ?Physical Exam ?Constitutional:   ?   Appearance: Normal appearance.  ?HENT:  ?   Head: Normocephalic.  ?   Mouth/Throat:  ?   Comments: Middle upper lip is swollen;  no other facial swelling is noted ?Cardiovascular:  ?   Rate and Rhythm: Normal rate.  ?Pulmonary:  ?   Effort: Pulmonary effort is normal.  ?Musculoskeletal:  ?   Cervical back: Normal range of motion and neck supple.  ?Skin: ?   Comments: Raised hive like rash on the torso ?  ?Neurological:  ?   General: No focal deficit present.  ?   Mental Status: She is alert.  ?Psychiatric:     ?   Mood and Affect: Mood normal.  ? ? ? ?UC Treatments / Results  ?Labs ?(all labs ordered are listed, but only abnormal results are displayed) ?Labs Reviewed - No data to display ? ?EKG ? ? ?Radiology ?No results found. ? ?Procedures ?Procedures (including critical care time) ? ?Medications Ordered in UC ?Medications  ?methylPREDNISolone sodium succinate (SOLU-MEDROL) 125 mg/2 mL injection 125 mg (has no administration in time range)  ? ? ?Initial Impression / Assessment and Plan / UC Course  ?I have reviewed the triage vital signs and the nursing notes. ? ?Pertinent labs & imaging results that were available during my care of the patient were reviewed by me and considered in my medical decision making (see chart for details). ? ?  ?Final Clinical Impressions(s) / UC Diagnoses  ? ?Final diagnoses:  ?Rash and nonspecific skin eruption  ?Lip swelling  ?Allergic reaction, initial encounter  ? ? ? ?Discharge Instructions   ? ?  ?You were seen today for an allergic reaction.  ?I have given you a shot of steroid today.  ?I have sent out oral prednisone as Rivera, please start this tomorrow.  ?You may take oral zyrtec or claritin daily, as Rivera as benadryl as needed throughout the day.  ?If you have worsening symptoms, or difficulty breathing, then please go to the ER for  evaluation.  ? ? ? ? ?ED Prescriptions   ? ? Medication Sig  Dispense Auth. Provider  ? predniSONE (DELTASONE) 50 MG tablet Take 1 tablet (50 mg total) by mouth daily for 5 days. 5 tablet Jannifer FranklinPiontek, Cherry Wittwer, MD  ? ?  ? ?PDMP not reviewed this encounter. ?  ?Jannifer FranklinPiontek, Sidnee Gambrill, MD ?01/25/22 0840 ? ?

## 2022-01-25 NOTE — ED Triage Notes (Signed)
C/o rash all over body x 2 days.  ?

## 2022-01-25 NOTE — Discharge Instructions (Addendum)
You were seen today for an allergic reaction.  ?I have given you a shot of steroid today.  ?I have sent out oral prednisone as well, please start this tomorrow.  ?You may take oral zyrtec or claritin daily, as well as benadryl as needed throughout the day.  ?If you have worsening symptoms, or difficulty breathing, then please go to the ER for evaluation.  ? ?

## 2022-02-21 ENCOUNTER — Other Ambulatory Visit (HOSPITAL_BASED_OUTPATIENT_CLINIC_OR_DEPARTMENT_OTHER): Payer: Self-pay

## 2022-02-21 ENCOUNTER — Encounter: Payer: Self-pay | Admitting: Family Medicine

## 2022-02-21 ENCOUNTER — Ambulatory Visit (INDEPENDENT_AMBULATORY_CARE_PROVIDER_SITE_OTHER): Payer: No Typology Code available for payment source | Admitting: Family Medicine

## 2022-02-21 ENCOUNTER — Other Ambulatory Visit (HOSPITAL_COMMUNITY)
Admission: RE | Admit: 2022-02-21 | Discharge: 2022-02-21 | Disposition: A | Discharge status: Home / Self Care | Payer: No Typology Code available for payment source | Source: Ambulatory Visit | Attending: Family Medicine | Admitting: Family Medicine

## 2022-02-21 VITALS — BP 118/83 | HR 74 | Ht 66.0 in | Wt 239.0 lb

## 2022-02-21 DIAGNOSIS — Z113 Encounter for screening for infections with a predominantly sexual mode of transmission: Secondary | ICD-10-CM | POA: Insufficient documentation

## 2022-02-21 DIAGNOSIS — Z01419 Encounter for gynecological examination (general) (routine) without abnormal findings: Secondary | ICD-10-CM

## 2022-02-21 MED ORDER — NORETHIN ACE-ETH ESTRAD-FE 1-20 MG-MCG(24) PO TABS
1.0000 | ORAL_TABLET | Freq: Every day | ORAL | 4 refills | Status: DC
Start: 2022-02-21 — End: 2022-12-31
  Filled 2022-02-21: qty 84, 84d supply, fill #0
  Filled 2022-03-13: qty 84, 63d supply, fill #0
  Filled 2022-05-29: qty 84, 63d supply, fill #1
  Filled 2022-07-31: qty 84, 63d supply, fill #2
  Filled 2022-10-15: qty 84, 63d supply, fill #3
  Filled 2022-12-30 – 2022-12-31 (×2): qty 84, 63d supply, fill #4

## 2022-02-21 NOTE — Progress Notes (Signed)
GYNECOLOGY ANNUAL PREVENTATIVE CARE ENCOUNTER NOTE  Subjective:   Diane Rivera is a 31 y.o. [phone] female here for a routine annual gynecologic exam.  Current complaints: none. Last visit she complained of right sided abdominal pain, which was improved with COCs. Would like to take them continuously. Is on Wegovy and has lost weight.  Denies abnormal vaginal bleeding, discharge, pelvic pain, problems with intercourse or other gynecologic concerns.    Gynecologic History No LMP recorded (lmp unknown). Contraception: OCP (estrogen/progesterone) Last Pap: 2022. Results were: normal   The pregnancy intention screening data noted above was reviewed. Potential methods of contraception were discussed. The patient elected to proceed with No data recorded.   Obstetric History OB History  Gravida Para Term Preterm AB Living  2 2 2     2   SAB IAB Ectopic Multiple Live Births        0 2    # Outcome Date GA Lbr Len/2nd Weight Sex Delivery Anes PTL Lv  2 Term 07/03/17 [redacted]w[redacted]d 08:10 / 00:20 7 lb 14 oz (3.572 kg) F Vag-Spont EPI  LIV  1 Term 2014 [redacted]w[redacted]d   F  EPI  LIV    Past Medical History:  Diagnosis Date   Depression    MODERATE PP DEPRESSIONS meds for 6 mos   Hypertension    Migraines     Past Surgical History:  Procedure Laterality Date   ANKLE SURGERY      Current Outpatient Medications on File Prior to Visit  Medication Sig Dispense Refill   Norethindrone Acetate-Ethinyl Estrad-FE (LOESTRIN 24 FE) 1-20 MG-MCG(24) tablet Take 1 tablet by mouth daily. Skip non-hormone tablets and start a new pack. 84 tablet 3   Semaglutide-Weight Management 2.4 MG/0.75ML SOAJ Inject 2.4 mg into the skin once a week for 28 days. 3 mL 0   tretinoin (RETIN-A) 0.025 % cream Apply on to the skin at bedtime. 45 g 1   venlafaxine XR (EFFEXOR-XR) 150 MG 24 hr capsule Take 1 capsule (150 mg total) by mouth daily with breakfast. Take with the 75 mg capsule for a total of 225 mg daily. 90 capsule 2    minocycline (MINOCIN) 50 MG capsule Take 1 capsule (50 mg total) by mouth 2 (two) times daily. (Patient not taking: Reported on 02/21/2022) 30 capsule 1   venlafaxine XR (EFFEXOR XR) 75 MG 24 hr capsule Take 1 capsule (75 mg total) by mouth daily with breakfast with the 150 mg capsule for a total of 225 mg daily. (Patient not taking: Reported on 02/21/2022) 90 capsule 2   No current facility-administered medications on file prior to visit.    No Known Allergies  Social History   Socioeconomic History   Marital status: Married    Spouse name: Not on file   Number of children: Not on file   Years of education: Not on file   Highest education level: Not on file  Occupational History   Not on file  Tobacco Use   Smoking status: Former   Smokeless tobacco: Never  Substance and Sexual Activity   Alcohol use: Yes   Drug use: No   Sexual activity: Yes    Birth control/protection: None  Other Topics Concern   Not on file  Social History Narrative   Not on file   Social Determinants of Health   Financial Resource Strain: Not on file  Food Insecurity: Not on file  Transportation Needs: Not on file  Physical Activity: Not on file  Stress:  Not on file  Social Connections: Not on file  Intimate Partner Violence: Not on file    Family History  Problem Relation Age of Onset   Hypertension Father    Lung cancer Maternal Grandmother    Cancer Maternal Grandfather        colon    The following portions of the patient's history were reviewed and updated as appropriate: allergies, current medications, past family history, past medical history, past social history, past surgical history and problem list.  Review of Systems Pertinent items are noted in HPI.   Objective:  BP 118/83   Pulse 74   Ht 5\' 6"  (1.676 m)   Wt 239 lb (108.4 kg)   LMP  (LMP Unknown)   BMI 38.58 kg/m  Wt Readings from Last 3 Encounters:  02/21/22 239 lb (108.4 kg)  10/03/21 265 lb 8 oz (120.4 kg)  03/02/21  288 lb (130.6 kg)     Chaperone present during exam  CONSTITUTIONAL: Well-developed, well-nourished female in no acute distress.  HENT:  Normocephalic, atraumatic, External right and left ear normal. Oropharynx is clear and moist EYES: Conjunctivae and EOM are normal. Pupils are equal, round, and reactive to light. No scleral icterus.  NECK: Normal range of motion, supple, no masses.  Normal thyroid.   CARDIOVASCULAR: Normal heart rate noted, regular rhythm RESPIRATORY: Clear to auscultation bilaterally. Effort and breath sounds normal, no problems with respiration noted. BREASTS: Symmetric in size. No masses, skin changes, nipple drainage, or lymphadenopathy. ABDOMEN: Soft, normal bowel sounds, no distention noted.  No tenderness, rebound or guarding.  PELVIC: Normal appearing external genitalia; normal appearing vaginal mucosa and cervix.  No abnormal discharge noted.  MUSCULOSKELETAL: Normal range of motion. No tenderness.  No cyanosis, clubbing, or edema.  2+ distal pulses. SKIN: Skin is warm and dry. No rash noted. Not diaphoretic. No erythema. No pallor. NEUROLOGIC: Alert and oriented to person, place, and time. Normal reflexes, muscle tone coordination. No cranial nerve deficit noted. PSYCHIATRIC: Normal mood and affect. Normal behavior. Normal judgment and thought content.  Assessment:  Annual gynecologic examination with pap smear   Plan:  1. Well Woman Exam - Cervicovaginal ancillary only( [place]) - HIV antibody (with reflex) - RPR - Hepatitis C Antibody - Hepatitis B Surface AntiGEN  2. Routine screening for STI (sexually transmitted infection)  - Cervicovaginal ancillary only( [place]) - HIV antibody (with reflex) - RPR - Hepatitis C Antibody - Hepatitis B Surface AntiGEN   Routine preventative health maintenance measures emphasized. Please refer to After Visit Summary for other counseling recommendations.    03/04/21, DO [facility]

## 2022-02-25 ENCOUNTER — Encounter: Payer: Self-pay | Admitting: Dermatology

## 2022-02-25 ENCOUNTER — Ambulatory Visit (INDEPENDENT_AMBULATORY_CARE_PROVIDER_SITE_OTHER): Payer: No Typology Code available for payment source | Admitting: Dermatology

## 2022-02-25 VITALS — Wt 239.0 lb

## 2022-02-25 DIAGNOSIS — L700 Acne vulgaris: Secondary | ICD-10-CM

## 2022-02-25 LAB — CERVICOVAGINAL ANCILLARY ONLY
Chlamydia: NEGATIVE
Comment: NEGATIVE
Comment: NEGATIVE
Comment: NORMAL
Neisseria Gonorrhea: NEGATIVE
Trichomonas: NEGATIVE

## 2022-03-14 ENCOUNTER — Other Ambulatory Visit (HOSPITAL_BASED_OUTPATIENT_CLINIC_OR_DEPARTMENT_OTHER): Payer: Self-pay

## 2022-03-14 ENCOUNTER — Telehealth: Payer: Self-pay | Admitting: Family Medicine

## 2022-03-14 ENCOUNTER — Other Ambulatory Visit: Payer: Self-pay | Admitting: Family Medicine

## 2022-03-14 DIAGNOSIS — F411 Generalized anxiety disorder: Secondary | ICD-10-CM

## 2022-03-14 MED ORDER — VENLAFAXINE HCL ER 75 MG PO CP24
75.0000 mg | ORAL_CAPSULE | Freq: Every day | ORAL | 2 refills | Status: AC
  Filled 2022-03-14: qty 90, 90d supply, fill #0
  Filled 2022-07-31 – 2022-11-06 (×2): qty 90, 90d supply, fill #1

## 2022-03-14 NOTE — Telephone Encounter (Signed)
Not on current list, but looks like instructions for the 150 mg includes taking 75 mg to equal 225 mg.??

## 2022-03-14 NOTE — Telephone Encounter (Signed)
She stated that he made no changes and she is still currently taking it. Refill sent in.

## 2022-03-14 NOTE — Telephone Encounter (Signed)
Initiated PA for [id number] KEY:  Rosilyn Mings

## 2022-03-15 ENCOUNTER — Other Ambulatory Visit (HOSPITAL_BASED_OUTPATIENT_CLINIC_OR_DEPARTMENT_OTHER): Payer: Self-pay

## 2022-03-15 ENCOUNTER — Other Ambulatory Visit (HOSPITAL_COMMUNITY): Payer: Self-pay

## 2022-03-18 ENCOUNTER — Encounter: Payer: Self-pay | Admitting: Dermatology

## 2022-03-18 NOTE — Telephone Encounter (Signed)
Approval dates from 03/17/2022 to 03/17/2023

## 2022-03-18 NOTE — Telephone Encounter (Signed)
Received approval Patient/pharmacy informed 

## 2022-03-18 NOTE — Progress Notes (Signed)
   Follow-Up Visit   Subjective  Diane Rivera is a 31 y.o. female who presents for the following: Acne (Patient here today for acne follow up. Per patient she's currently using Tretinoin and MCN no improvement. Patient would like to discuss starting Isotretinoin. ).  Acne, predominantly facial, improvement with current routine has been modest [place]. Location:  Duration:  Quality:  Associated Signs/Symptoms: Modifying Factors:  Severity:  Timing: Context:   Objective  Well appearing patient in no apparent distress; mood and affect are within normal limits. Head - Anterior (Face) Subtle improvement in deep inflammatory papules but still active inflammatory more than comedonal acne.  Again reviewed isotretinoin in detail.  This treatment will be deferred.    A focused examination was performed including head and neck.. Relevant physical exam findings are noted in the Assessment and Plan.   Assessment & Plan    Acne vulgaris Head - Anterior (Face)  Continue tretinoin plus oral minocycline, but I am comfortable with switch to isotretinoin if patient so desires.  Related Medications minocycline (MINOCIN) 50 MG capsule Take 1 capsule (50 mg total) by mouth 2 (two) times daily.  tretinoin (RETIN-A) 0.025 % cream Apply on to the skin at bedtime.      I, Janalyn Harder, MD, have reviewed all documentation for this visit.  The documentation on 03/18/22 for the exam, diagnosis, procedures, and orders are all accurate and complete.

## 2022-03-20 ENCOUNTER — Other Ambulatory Visit (HOSPITAL_BASED_OUTPATIENT_CLINIC_OR_DEPARTMENT_OTHER): Payer: Self-pay

## 2022-03-21 ENCOUNTER — Other Ambulatory Visit (HOSPITAL_BASED_OUTPATIENT_CLINIC_OR_DEPARTMENT_OTHER): Payer: Self-pay

## 2022-03-21 ENCOUNTER — Other Ambulatory Visit (HOSPITAL_COMMUNITY): Payer: Self-pay

## 2022-03-29 ENCOUNTER — Other Ambulatory Visit (HOSPITAL_COMMUNITY): Payer: Self-pay

## 2022-04-02 ENCOUNTER — Encounter: Payer: Self-pay | Admitting: Family Medicine

## 2022-04-02 ENCOUNTER — Other Ambulatory Visit (HOSPITAL_BASED_OUTPATIENT_CLINIC_OR_DEPARTMENT_OTHER): Payer: Self-pay

## 2022-04-02 ENCOUNTER — Encounter: Payer: Self-pay | Admitting: Dermatology

## 2022-04-02 ENCOUNTER — Other Ambulatory Visit (HOSPITAL_COMMUNITY): Payer: Self-pay

## 2022-04-02 ENCOUNTER — Ambulatory Visit (INDEPENDENT_AMBULATORY_CARE_PROVIDER_SITE_OTHER): Payer: No Typology Code available for payment source | Admitting: Dermatology

## 2022-04-02 ENCOUNTER — Ambulatory Visit (INDEPENDENT_AMBULATORY_CARE_PROVIDER_SITE_OTHER): Payer: No Typology Code available for payment source | Admitting: Family Medicine

## 2022-04-02 VITALS — BP 110/79 | HR 81 | Temp 98.1°F | Ht 66.0 in | Wt 244.5 lb

## 2022-04-02 DIAGNOSIS — Z5181 Encounter for therapeutic drug level monitoring: Secondary | ICD-10-CM

## 2022-04-02 DIAGNOSIS — Z113 Encounter for screening for infections with a predominantly sexual mode of transmission: Secondary | ICD-10-CM

## 2022-04-02 DIAGNOSIS — Z114 Encounter for screening for human immunodeficiency virus [HIV]: Secondary | ICD-10-CM

## 2022-04-02 DIAGNOSIS — Z1159 Encounter for screening for other viral diseases: Secondary | ICD-10-CM

## 2022-04-02 DIAGNOSIS — Z0000 Encounter for general adult medical examination without abnormal findings: Secondary | ICD-10-CM

## 2022-04-02 DIAGNOSIS — L700 Acne vulgaris: Secondary | ICD-10-CM | POA: Diagnosis not present

## 2022-04-02 LAB — COMPREHENSIVE METABOLIC PANEL
ALT: 11 U/L (ref 0–35)
AST: 14 U/L (ref 0–37)
Albumin: 4.1 g/dL (ref 3.5–5.2)
Alkaline Phosphatase: 49 U/L (ref 39–117)
BUN: 9 mg/dL (ref 6–23)
CO2: 26 mEq/L (ref 19–32)
Calcium: 9.2 mg/dL (ref 8.4–10.5)
Chloride: 104 mEq/L (ref 96–112)
Creatinine, Ser: 0.76 mg/dL (ref 0.40–1.20)
GFR: 105.8 mL/min (ref >60.00)
Glucose, Bld: 72 mg/dL (ref 70–99)
Potassium: 4.1 mEq/L (ref 3.5–5.1)
Sodium: 138 mEq/L (ref 135–145)
Total Bilirubin: 0.3 mg/dL (ref 0.2–1.2)
Total Protein: 6.8 g/dL (ref 6.0–8.3)

## 2022-04-02 LAB — CBC
HCT: 41 % (ref 36.0–46.0)
Hemoglobin: 13.5 g/dL (ref 12.0–15.0)
MCHC: 32.9 g/dL (ref 30.0–36.0)
MCV: 91.4 fl (ref 78.0–100.0)
Platelets: 277 10*3/uL (ref 150.0–400.0)
RBC: 4.49 Mil/uL (ref 3.87–5.11)
RDW: 13.3 % (ref 11.5–15.5)
WBC: 5.7 10*3/uL (ref 4.0–10.5)

## 2022-04-02 LAB — LIPID PANEL
Cholesterol: 191 mg/dL (ref 0–200)
HDL: 80.4 mg/dL (ref >39.00)
LDL Cholesterol: 77 mg/dL (ref 0–99)
NonHDL: 110.67
Total CHOL/HDL Ratio: 2
Triglycerides: 168 mg/dL — ABNORMAL HIGH (ref 0.0–149.0)
VLDL: 33.6 mg/dL (ref 0.0–40.0)

## 2022-04-02 MED ORDER — ISOTRETINOIN 40 MG PO CAPS
40.0000 mg | ORAL_CAPSULE | Freq: Every day | ORAL | 0 refills | Status: AC
  Filled 2022-04-02 – 2022-04-03 (×2): qty 30, 30d supply, fill #0

## 2022-04-02 NOTE — Progress Notes (Signed)
Chief Complaint  Patient presents with   Annual Exam     Well Woman Diane Rivera is here for a complete physical.   Her last physical was >1 year ago.  Current diet: in general, a "healthy" diet. Current exercise: none. Contraception? Yes Fatigue out of ordinary? No She is losing weight.  Seatbelt? Yes Advanced directive? No  Health Maintenance Pap/HPV- Yes Tetanus- Yes HIV screening- Yes Hep C screening- Yes  Past Medical History:  Diagnosis Date   Depression    MODERATE PP DEPRESSIONS meds for 6 mos   Hypertension    Migraines      Past Surgical History:  Procedure Laterality Date   ANKLE SURGERY      Medications  Current Outpatient Medications on File Prior to Visit  Medication Sig Dispense Refill   Norethindrone Acetate-Ethinyl Estrad-FE (LOESTRIN 24 FE) 1-20 MG-MCG(24) tablet Take 1 tablet by mouth daily. Skip non-hormone tablets and start a new pack. 84 tablet 4   Semaglutide-Weight Management 2.4 MG/0.75ML SOAJ Inject 2.4 mg into the skin once a week for 28 days. 3 mL 0   venlafaxine XR (EFFEXOR XR) 75 MG 24 hr capsule Take 1 capsule (75 mg total) by mouth daily with breakfast with the 150 mg capsule for a total of 225 mg daily. 90 capsule 2   venlafaxine XR (EFFEXOR-XR) 150 MG 24 hr capsule Take 1 capsule (150 mg total) by mouth daily with breakfast. Take with the 75 mg capsule for a total of 225 mg daily. 90 capsule 2   Allergies No Known Allergies  Review of Systems: Constitutional:  no unexpected weight changes Eye:  no recent significant change in vision Ear/Nose/Mouth/Throat:  Ears:  no tinnitus or vertigo and no recent change in hearing Nose/Mouth/Throat:  no complaints of nasal congestion, no sore throat Cardiovascular: no chest pain Respiratory:  no cough and no shortness of breath Gastrointestinal:  no abdominal pain, no change in bowel habits GU:  Female: negative for dysuria or pelvic pain Musculoskeletal/Extremities:  no pain of the  joints Integumentary (Skin/Breast):  no abnormal skin lesions reported Neurologic:  no headaches Endocrine:  denies fatigue Hematologic/Lymphatic:  No areas of easy bleeding  Exam BP 110/79   Pulse 81   Temp 98.1 F (36.7 C) (Oral)   Ht 5\' 6"  (1.676 m)   Wt 244 lb 8 oz (110.9 kg)   SpO2 98%   BMI 39.46 kg/m  General:  well developed, well nourished, in no apparent distress Skin:  no significant moles, warts, or growths Head:  no masses, lesions, or tenderness Eyes:  pupils equal and round, sclera anicteric without injection Ears:  canals without lesions, TMs shiny without retraction, no obvious effusion, no erythema Nose:  nares patent, septum midline, mucosa normal, and no drainage or sinus tenderness Throat/Pharynx:  lips and gingiva without lesion; tongue and uvula midline; non-inflamed pharynx; no exudates or postnasal drainage Neck: neck supple without adenopathy, thyromegaly, or masses Lungs:  clear to auscultation, breath sounds equal bilaterally, no respiratory distress Cardio:  regular rate and rhythm, no bruits, no LE edema Abdomen:  abdomen soft, nontender; bowel sounds normal; no masses or organomegaly Genital: Defer to GYN Musculoskeletal:  symmetrical muscle groups noted without atrophy or deformity Extremities:  no clubbing, cyanosis, or edema, no deformities, no skin discoloration Neuro:  gait normal; deep tendon reflexes normal and symmetric Psych: well oriented with normal range of affect and appropriate judgment/insight  Assessment and Plan  Well adult exam - Plan: hCG, serum, qualitative, CBC,  Comprehensive metabolic panel, Lipid panel  Routine screening for STI (sexually transmitted infection) - Plan: Hepatitis B surface antigen, RPR  Screening for HIV (human immunodeficiency virus) - Plan: HIV Antibody (routine testing w rflx)  Encounter for hepatitis C screening test for low risk patient - Plan: Hepatitis C antibody   Well 31 y.o. female. Counseled  on diet and exercise. Other orders as above. Advanced directive form provided today.  Starting accutane, will ck serum preg in addition to labs desired by her GYN.  Follow up in 6 mo or prn. The patient voiced understanding and agreement to the plan.  Jilda Roche [place], DO 04/02/22 12:52 PM

## 2022-04-02 NOTE — Patient Instructions (Signed)
Give us 2-3 business days to get the results of your labs back.   Keep the diet clean and stay active.  Please get me a copy of your advanced directive form at your convenience.   Let us know if you need anything.  

## 2022-04-03 ENCOUNTER — Other Ambulatory Visit (HOSPITAL_COMMUNITY): Payer: Self-pay

## 2022-04-03 LAB — HEPATITIS C ANTIBODY: Hepatitis C Ab: NONREACTIVE

## 2022-04-03 LAB — HIV ANTIBODY (ROUTINE TESTING W REFLEX): HIV 1&2 Ab, 4th Generation: NONREACTIVE

## 2022-04-03 LAB — RPR: RPR Ser Ql: NONREACTIVE

## 2022-04-03 LAB — HEPATITIS B SURFACE ANTIGEN: Hepatitis B Surface Ag: NONREACTIVE

## 2022-04-03 LAB — HCG, SERUM, QUALITATIVE: Preg, Serum: NEGATIVE

## 2022-04-12 ENCOUNTER — Other Ambulatory Visit (HOSPITAL_COMMUNITY): Payer: Self-pay

## 2022-04-22 ENCOUNTER — Other Ambulatory Visit (HOSPITAL_COMMUNITY): Payer: Self-pay

## 2022-04-24 ENCOUNTER — Encounter: Payer: Self-pay | Admitting: Dermatology

## 2022-04-24 NOTE — Progress Notes (Signed)
   Follow-Up Visit   Subjective  Diane Rivera is a 31 y.o. female who presents for the following: Acne (31 days f/u- here to start isotretinoin ).  Isotretinoin new start Location:  Duration:  Quality:  Associated Signs/Symptoms: Modifying Factors:  Severity:  Timing: Context:   Objective  Well appearing patient in no apparent distress; mood and affect are within normal limits. Head - Anterior (Face) Cystic acne, predominantly inflammatory.  Essentially all treatment options again reviewed along with detailed information about isotretinoin and I pledge.  She does wish to proceed with estrogen therapy.    A focused examination was performed including head and neck. Relevant physical exam findings are noted in the Assessment and Plan.   Assessment & Plan    Acne vulgaris Head - Anterior (Face)  Patient going to PCP today for annual physical- sent over order for pregnancy test with patient.  Although we will start therapy at 40 mg by mouth once daily, in order to reach a target dose and a reasonably short time we would increase the daily dose if she tolerates it.  Warned about sun protection.  hCG, serum, qualitative - Head - Anterior (Face)  ISOtretinoin (ACCUTANE) 40 MG capsule - Head - Anterior (Face) Take 1 capsule (40 mg total) by mouth daily.  Encounter for medication monitoring  Related Procedures hCG, serum, qualitative  Related Medications ISOtretinoin (ACCUTANE) 40 MG capsule Take 1 capsule (40 mg total) by mouth daily.      I, Janalyn Harder, MD, have reviewed all documentation for this visit.  The documentation on 04/24/22 for the exam, diagnosis, procedures, and orders are all accurate and complete.

## 2022-04-29 ENCOUNTER — Other Ambulatory Visit (HOSPITAL_BASED_OUTPATIENT_CLINIC_OR_DEPARTMENT_OTHER): Payer: Self-pay

## 2022-05-03 ENCOUNTER — Other Ambulatory Visit: Payer: Self-pay | Admitting: Family Medicine

## 2022-05-03 ENCOUNTER — Other Ambulatory Visit (HOSPITAL_BASED_OUTPATIENT_CLINIC_OR_DEPARTMENT_OTHER): Payer: Self-pay

## 2022-05-03 MED ORDER — WEGOVY 2.4 MG/0.75ML ~~LOC~~ SOAJ
2.4000 mg | SUBCUTANEOUS | 0 refills | Status: AC
  Filled 2022-05-03: qty 3, 28d supply, fill #0

## 2022-05-07 ENCOUNTER — Ambulatory Visit: Payer: No Typology Code available for payment source | Admitting: Physician Assistant

## 2022-05-29 ENCOUNTER — Other Ambulatory Visit (HOSPITAL_BASED_OUTPATIENT_CLINIC_OR_DEPARTMENT_OTHER): Payer: Self-pay

## 2022-07-31 ENCOUNTER — Other Ambulatory Visit (HOSPITAL_BASED_OUTPATIENT_CLINIC_OR_DEPARTMENT_OTHER): Payer: Self-pay

## 2022-08-02 ENCOUNTER — Other Ambulatory Visit (HOSPITAL_BASED_OUTPATIENT_CLINIC_OR_DEPARTMENT_OTHER): Payer: Self-pay

## 2022-10-15 ENCOUNTER — Other Ambulatory Visit (HOSPITAL_COMMUNITY): Payer: Self-pay

## 2022-10-15 ENCOUNTER — Other Ambulatory Visit: Payer: Self-pay

## 2022-10-18 ENCOUNTER — Other Ambulatory Visit (HOSPITAL_COMMUNITY): Payer: Self-pay

## 2022-10-31 ENCOUNTER — Telehealth: Payer: 59 | Admitting: Nurse Practitioner

## 2022-10-31 ENCOUNTER — Telehealth: Payer: 59

## 2022-10-31 DIAGNOSIS — H5789 Other specified disorders of eye and adnexa: Secondary | ICD-10-CM

## 2022-10-31 MED ORDER — POLYMYXIN B-TRIMETHOPRIM 10000-0.1 UNIT/ML-% OP SOLN
1.0000 [drp] | Freq: Four times a day (QID) | OPHTHALMIC | 0 refills | Status: DC
  Filled 2022-10-31: qty 10, 50d supply, fill #0

## 2022-10-31 NOTE — Patient Instructions (Signed)
  Lavinia Sharps, thank you for joining Tish Men, NP for today's virtual visit.  While this provider is not your primary care provider (PCP), if your PCP is located in our provider database this encounter information will be shared with them immediately following your visit.   [facility] account gives you access to today's visit and all your visits, tests, and labs performed at [facility] " click here if you don't have a [facility] account or go to [email or web address]  Consent: (Patient) Diane Rivera provided verbal consent for this virtual visit at the beginning of the encounter.  Current Medications:  Current Outpatient Medications:    trimethoprim-polymyxin b (POLYTRIM) ophthalmic solution, Place 1 drop into both eyes every 6 (six) hours., Disp: 10 mL, Rfl: 0   Norethindrone Acetate-Ethinyl Estrad-FE (LOESTRIN 24 FE) 1-20 MG-MCG(24) tablet, Take 1 tablet by mouth daily. Skip non-hormone tablets and start a new pack., Disp: 84 tablet, Rfl: 4   venlafaxine XR (EFFEXOR XR) 75 MG 24 hr capsule, Take 1 capsule (75 mg total) by mouth daily with breakfast with the 150 mg capsule for a total of 225 mg daily., Disp: 90 capsule, Rfl: 2   venlafaxine XR (EFFEXOR-XR) 150 MG 24 hr capsule, Take 1 capsule (150 mg total) by mouth daily with breakfast. Take with the 75 mg capsule for a total of 225 mg daily., Disp: 90 capsule, Rfl: 2   Medications ordered in this encounter:  Meds ordered this encounter  Medications   trimethoprim-polymyxin b (POLYTRIM) ophthalmic solution    Sig: Place 1 drop into both eyes every 6 (six) hours.    Dispense:  10 mL    Refill:  0     *If you need refills on other medications prior to your next appointment, please contact your pharmacy*  Follow-Up: Call back or seek an in-person evaluation if the symptoms worsen or if the condition fails to improve as anticipated.  [facility] [phone]  Other Instructions Use eyedrops as prescribed.  Recommend using them in both eyes in the event that the infection spreads. Cool compresses to the eyes to help with pain or swelling. May use over-the-counter eyedrops such as Visine or Clear Eyes to help keep the eyes moist and decreased redness. Strict handwashing when applying medication.  Avoid rubbing or manipulating the eyes while symptoms persist. Discard any eye make-up that were using prior to your symptoms starting. Go to the ER if you develop sudden loss of vision, change in vision or other concerns.  Follow-up with your eye doctor if symptoms fail to improve.    If you have been instructed to have an in-person evaluation today at a local Urgent Care facility, please use the link below. It will take you to a list of all of our available [facility], including address, phone number and hours of operation. Please do not delay care.  [facility]  If you or a family member do not have a primary care provider, use the link below to schedule a visit and establish care. When you choose a [facility] primary care physician or advanced practice provider, you gain a long-term partner in health. Find a Primary Care Provider  Learn more about [facility]'s in-office and virtual care options: [facility]

## 2022-10-31 NOTE — Progress Notes (Signed)
Virtual Visit Consent   Diane Rivera, you are scheduled for a virtual visit with a [facility] provider today. Just as with appointments in the office, your consent must be obtained to participate. Your consent will be active for this visit and any virtual visit you may have with one of our providers in the next 365 days. If you have a MyChart account, a copy of this consent can be sent to you electronically.  As this is a virtual visit, video technology does not allow for your provider to perform a traditional examination. This may limit your provider's ability to fully assess your condition. If your provider identifies any concerns that need to be evaluated in person or the need to arrange testing (such as labs, EKG, etc.), we will make arrangements to do so. Although advances in technology are sophisticated, we cannot ensure that it will always work on either your end or our end. If the connection with a video visit is poor, the visit may have to be switched to a telephone visit. With either a video or telephone visit, we are not always able to ensure that we have a secure connection.  By engaging in this virtual visit, you consent to the provision of healthcare and authorize for your insurance to be billed (if applicable) for the services provided during this visit. Depending on your insurance coverage, you may receive a charge related to this service.  I need to obtain your verbal consent now. Are you willing to proceed with your visit today? Diane Rivera has provided verbal consent on 10/31/2022 for a virtual visit (video or telephone). Tish Men, NP  Date: 10/31/2022 6:28 PM  Virtual Visit via Video Note   I, [place], connected with  Diane Rivera  ([id number], [birth year]) on 10/31/22 at  6:30 PM EST by a video-enabled telemedicine application and verified that I am speaking with the correct person using two identifiers.  Location: Patient: Virtual  Visit Location Patient: Home Provider: Virtual Visit Location Provider: Home   I discussed the limitations of evaluation and management by telemedicine and the availability of in person appointments. The patient expressed understanding and agreed to proceed.    History of Present Illness: Diane Rivera is a 32 y.o. who identifies as a female who was assigned female at birth, and is being seen today for left eye complaints. Patient states she woke up this morning with left eye redness, crusting, and small amount of drainage. She states her vision has been a little more blurry in the left eye. She also complains of left eye pressure. She denies fever, chills, photophobia, loss of vision, recent URI or close sick contacts. Patient also wears glasses and contacts, but has not worn her contacts in the past 2 weeks.   HPI: HPI  Problems:  Patient Active Problem List   Diagnosis Date Noted   GAD (generalized anxiety disorder) 01/30/2021   Acne 01/30/2021   Sprain of ankle, right 10/28/2019   Anxiety and depression 04/27/2018   Morbid obesity ([place]) 04/27/2018   Pregnant 07/03/2017    Allergies: No Known Allergies Medications:  Current Outpatient Medications:    Norethindrone Acetate-Ethinyl Estrad-FE (LOESTRIN 24 FE) 1-20 MG-MCG(24) tablet, Take 1 tablet by mouth daily. Skip non-hormone tablets and start a new pack., Disp: 84 tablet, Rfl: 4   venlafaxine XR (EFFEXOR XR) 75 MG 24 hr capsule, Take 1 capsule (75 mg total) by mouth daily with breakfast with the 150 mg capsule for a total of 225  mg daily., Disp: 90 capsule, Rfl: 2   venlafaxine XR (EFFEXOR-XR) 150 MG 24 hr capsule, Take 1 capsule (150 mg total) by mouth daily with breakfast. Take with the 75 mg capsule for a total of 225 mg daily., Disp: 90 capsule, Rfl: 2  Observations/Objective: Patient is well-developed, well-nourished in no acute distress.  Resting comfortably at home.  Head is normocephalic, atraumatic.  No labored  breathing.  Speech is clear and coherent with logical content.  Patient is alert and oriented at baseline.    Assessment and Plan: 1. Inflammation of left eye - trimethoprim-polymyxin b (POLYTRIM) ophthalmic solution; Place 1 drop into both eyes every 6 (six) hours.  Dispense: 10 mL; Refill: 0  Suspect conjunctivitis, most likely viral but patient reports worsening symptoms since onset earlier today. Will cover with Polymixin-B at this time. Supportive care recommendations were provided to the patient, along with indications of when follow-up are indicated. ER if sudden change in vison, loss of vision or other concerns. Patient is in agreement with this plan of care, patient verbalizes understanding, all questions were answered.   Follow Up Instructions: I discussed the assessment and treatment plan with the patient. The patient was provided an opportunity to ask questions and all were answered. The patient agreed with the plan and demonstrated an understanding of the instructions.  A copy of instructions were sent to the patient via MyChart unless otherwise noted below.     The patient was advised to call back or seek an in-person evaluation if the symptoms worsen or if the condition fails to improve as anticipated.  Time:  I spent 10  minutes with the patient via telehealth technology discussing the above problems/concerns.    Tish Men, NP

## 2022-11-01 ENCOUNTER — Other Ambulatory Visit (HOSPITAL_COMMUNITY): Payer: Self-pay

## 2022-11-08 ENCOUNTER — Other Ambulatory Visit (HOSPITAL_COMMUNITY): Payer: Self-pay

## 2022-12-02 NOTE — Progress Notes (Unsigned)
     Established patient visit   Patient: Diane Rivera   DOB: 1991-03-01   32 y.o. Female  MRN: [id number] Visit Date: 12/03/2022  Today's healthcare provider: Owens Loffler, DO   No chief complaint on file.   SUBJECTIVE   No chief complaint on file.  HPI    Review of Systems     No outpatient medications have been marked as taking for the 12/03/22 encounter (Appointment) with Owens Loffler, DO.    OBJECTIVE    There were no vitals taken for this visit.  Physical Exam   {Show previous labs (optional):23736}    ASSESSMENT & PLAN    Problem List Items Addressed This Visit   None   No follow-ups on file.      No orders of the defined types were placed in this encounter.   No orders of the defined types were placed in this encounter.    Owens Loffler, DO  [place] [facility] [phone] (phone) [phone] (fax)  [place]

## 2022-12-03 ENCOUNTER — Encounter: Payer: Self-pay | Admitting: Family Medicine

## 2022-12-03 ENCOUNTER — Telehealth: Payer: Self-pay

## 2022-12-03 ENCOUNTER — Other Ambulatory Visit (HOSPITAL_BASED_OUTPATIENT_CLINIC_OR_DEPARTMENT_OTHER): Payer: Self-pay

## 2022-12-03 ENCOUNTER — Ambulatory Visit (INDEPENDENT_AMBULATORY_CARE_PROVIDER_SITE_OTHER): Payer: 59 | Admitting: Family Medicine

## 2022-12-03 ENCOUNTER — Other Ambulatory Visit: Payer: Self-pay

## 2022-12-03 ENCOUNTER — Other Ambulatory Visit (HOSPITAL_COMMUNITY): Payer: Self-pay

## 2022-12-03 VITALS — BP 119/93 | HR 90 | Ht 66.0 in | Wt 225.0 lb

## 2022-12-03 DIAGNOSIS — Z6836 Body mass index (BMI) 36.0-36.9, adult: Secondary | ICD-10-CM

## 2022-12-03 DIAGNOSIS — Z87898 Personal history of other specified conditions: Secondary | ICD-10-CM | POA: Diagnosis not present

## 2022-12-03 LAB — POCT GLYCOSYLATED HEMOGLOBIN (HGB A1C): HbA1c POC (<> result, manual entry): 4.7 % (ref 4.0–5.6)

## 2022-12-03 MED ORDER — WEGOVY 2.4 MG/0.75ML ~~LOC~~ SOAJ
2.4000 mg | SUBCUTANEOUS | 1 refills | Status: DC
  Filled 2022-12-03: qty 3, 28d supply, fill #0

## 2022-12-03 MED ORDER — WEGOVY 1.7 MG/0.75ML ~~LOC~~ SOAJ
1.7000 mg | SUBCUTANEOUS | 0 refills | Status: DC
  Filled 2022-12-03: qty 3, 28d supply, fill #0

## 2022-12-03 MED ORDER — WEGOVY 0.25 MG/0.5ML ~~LOC~~ SOAJ
0.2500 mg | SUBCUTANEOUS | 0 refills | Status: DC
  Filled 2022-12-03 (×2): qty 2, 28d supply, fill #0

## 2022-12-03 MED ORDER — ZEPBOUND 2.5 MG/0.5ML ~~LOC~~ SOAJ
2.5000 mg | SUBCUTANEOUS | 0 refills | Status: DC
  Filled 2022-12-03: qty 2, 28d supply, fill #0

## 2022-12-03 NOTE — Assessment & Plan Note (Signed)
-   Point-of-care A1c today is 4.7 - Discussed continue diet and exercise

## 2022-12-03 NOTE — Assessment & Plan Note (Signed)
-  Previously on Wegovy, we will restart.  Have ordered [facility] dosing.  Since patient has been on this in the past I feel confident she is able to inject medication herself we will restart taper - Follow-up in 3 months - Discussed diet and exercise and also increasing protein rich foods in her diet - Discussed different exercise options

## 2022-12-03 NOTE — Telephone Encounter (Signed)
Initiated Prior authorization [id number] 0.25MG /0.5ML auto-injectors Via: Covermymeds Case/Key:[id number] Status: approved as of 12/03/22 Reason:Drug is covered by current benefit plan. No further PA activity needed Notified Pt via: Mychart

## 2022-12-04 MED ORDER — WEGOVY 0.25 MG/0.5ML ~~LOC~~ SOAJ: 0.2500 mg | SUBCUTANEOUS | 0 refills | Status: AC

## 2022-12-04 MED ORDER — WEGOVY 1.7 MG/0.75ML ~~LOC~~ SOAJ: 1.7000 mg | SUBCUTANEOUS | 0 refills | Status: AC

## 2022-12-04 MED ORDER — WEGOVY 2.4 MG/0.75ML ~~LOC~~ SOAJ: 2.4000 mg | SUBCUTANEOUS | 1 refills | Status: AC

## 2022-12-30 ENCOUNTER — Other Ambulatory Visit (HOSPITAL_COMMUNITY): Payer: Self-pay

## 2022-12-31 ENCOUNTER — Other Ambulatory Visit: Payer: Self-pay

## 2022-12-31 ENCOUNTER — Other Ambulatory Visit (HOSPITAL_COMMUNITY): Payer: Self-pay

## 2022-12-31 MED ORDER — NORETHIN ACE-ETH ESTRAD-FE 1-20 MG-MCG(24) PO TABS: 1.0000 | ORAL_TABLET | Freq: Every day | ORAL | 4 refills | Status: DC

## 2022-12-31 NOTE — Progress Notes (Signed)
Patient needs prescription switched to [facility] in [place]. Armandina Stammer RN

## 2023-02-12 ENCOUNTER — Other Ambulatory Visit: Payer: Self-pay

## 2023-02-12 DIAGNOSIS — F411 Generalized anxiety disorder: Secondary | ICD-10-CM

## 2023-02-12 NOTE — Telephone Encounter (Signed)
Patient called after hours nurse service. Requesting a med refill for their venlafaxine rx. Written by an external provider. Rx pended.

## 2023-02-14 NOTE — Telephone Encounter (Signed)
LVM for pt to call back and schedule.

## 2023-03-04 ENCOUNTER — Encounter (INDEPENDENT_AMBULATORY_CARE_PROVIDER_SITE_OTHER): Payer: 59 | Admitting: Family Medicine

## 2023-03-04 DIAGNOSIS — F411 Generalized anxiety disorder: Secondary | ICD-10-CM

## 2023-03-04 MED ORDER — VENLAFAXINE HCL ER 37.5 MG PO CP24: ORAL_CAPSULE | ORAL | 0 refills | Status: AC

## 2023-03-04 NOTE — Telephone Encounter (Signed)
Please see the MyChart message reply(ies) for my assessment and plan.    This patient gave consent for this Medical Advice Message and is aware that it may result in a bill to [employer], as well as the possibility of receiving a bill for a co-payment or deductible. They are an established patient, but are not seeking medical advice exclusively about a problem treated during an in person or video visit in the last seven days. I did not recommend an in person or video visit within seven days of my reply.    I spent a total of 7 minutes cumulative time within 7 days through [employer].  Pt wants to wean off effexor. Discussed in mychart message  Charlton Amor, DO

## 2023-03-05 ENCOUNTER — Other Ambulatory Visit: Payer: Self-pay | Admitting: Family Medicine

## 2023-03-06 ENCOUNTER — Other Ambulatory Visit: Payer: Self-pay | Admitting: Physician Assistant

## 2023-03-06 DIAGNOSIS — F411 Generalized anxiety disorder: Secondary | ICD-10-CM

## 2023-03-31 ENCOUNTER — Other Ambulatory Visit: Payer: Self-pay | Admitting: Family Medicine

## 2023-12-20 ENCOUNTER — Other Ambulatory Visit: Payer: Self-pay | Admitting: Family Medicine

## 2024-01-22 ENCOUNTER — Encounter: Payer: Self-pay | Admitting: Family Medicine
